# Patient Record
Sex: Female | Born: 1975 | Hispanic: Yes | Marital: Married | Smoking: Never smoker
Health system: Southern US, Community
[De-identification: ages and names within clinical notes are randomized; demographics above are authoritative.]

## PROBLEM LIST (undated history)

## (undated) DIAGNOSIS — B009 Herpesviral infection, unspecified: Secondary | ICD-10-CM

## (undated) HISTORY — PX: TUBAL LIGATION: SHX77

## (undated) HISTORY — DX: Herpesviral infection, unspecified: B00.9

---

## 2000-11-13 ENCOUNTER — Ambulatory Visit (HOSPITAL_COMMUNITY): Admission: RE | Admit: 2000-11-13 | Discharge: 2000-11-13 | Payer: Self-pay | Admitting: *Deleted

## 2001-04-03 ENCOUNTER — Inpatient Hospital Stay (HOSPITAL_COMMUNITY): Admission: AD | Admit: 2001-04-03 | Discharge: 2001-04-03 | Payer: Self-pay | Admitting: *Deleted

## 2001-04-04 ENCOUNTER — Inpatient Hospital Stay (HOSPITAL_COMMUNITY): Admission: AD | Admit: 2001-04-04 | Discharge: 2001-04-04 | Payer: Self-pay | Admitting: *Deleted

## 2001-04-04 ENCOUNTER — Inpatient Hospital Stay (HOSPITAL_COMMUNITY): Admission: AD | Admit: 2001-04-04 | Discharge: 2001-04-07 | Payer: Self-pay | Admitting: *Deleted

## 2002-07-01 ENCOUNTER — Encounter: Payer: Self-pay | Admitting: Obstetrics and Gynecology

## 2002-07-01 ENCOUNTER — Ambulatory Visit (HOSPITAL_COMMUNITY): Admission: RE | Admit: 2002-07-01 | Discharge: 2002-07-01 | Payer: Self-pay | Admitting: Obstetrics and Gynecology

## 2002-12-26 ENCOUNTER — Ambulatory Visit (HOSPITAL_COMMUNITY): Admission: RE | Admit: 2002-12-26 | Discharge: 2002-12-26 | Payer: Self-pay | Admitting: Family Medicine

## 2003-09-17 ENCOUNTER — Ambulatory Visit (HOSPITAL_COMMUNITY): Admission: RE | Admit: 2003-09-17 | Discharge: 2003-09-17 | Payer: Self-pay | Admitting: Family Medicine

## 2003-11-20 ENCOUNTER — Ambulatory Visit (HOSPITAL_COMMUNITY): Admission: RE | Admit: 2003-11-20 | Discharge: 2003-11-20 | Payer: Self-pay | Admitting: Obstetrics and Gynecology

## 2005-11-14 ENCOUNTER — Emergency Department (HOSPITAL_COMMUNITY): Admission: EM | Admit: 2005-11-14 | Discharge: 2005-11-14 | Payer: Self-pay | Admitting: Emergency Medicine

## 2015-09-21 ENCOUNTER — Other Ambulatory Visit: Payer: Self-pay | Admitting: Physician Assistant

## 2015-12-28 ENCOUNTER — Ambulatory Visit: Payer: Self-pay | Admitting: Physician Assistant

## 2015-12-31 ENCOUNTER — Ambulatory Visit: Payer: Self-pay | Admitting: Physician Assistant

## 2015-12-31 ENCOUNTER — Encounter: Payer: Self-pay | Admitting: Physician Assistant

## 2015-12-31 VITALS — BP 104/64 | HR 82 | Temp 98.1°F | Ht 59.5 in | Wt 172.8 lb

## 2015-12-31 DIAGNOSIS — L989 Disorder of the skin and subcutaneous tissue, unspecified: Secondary | ICD-10-CM

## 2015-12-31 NOTE — Progress Notes (Signed)
   BP 104/64 mmHg  Pulse 82  Temp(Src) 98.1 F (36.7 C)  Ht 4' 11.5" (1.511 m)  Wt 172 lb 12 oz (78.359 kg)  BMI 34.32 kg/m2  SpO2 99%   Subjective:    Patient ID: Carol Huff, female    DOB: 09-Mar-1976, 40 y.o.   MRN: [id number]  HPI: Carol Huff is a 40 y.o. female presenting on 12/31/2015 for Mass   HPI Chief Complaint  Patient presents with  . Mass    between R thigh and vulva. appeared about week and a half ago. has applied peroxide, pt states it has gotten a lot better. pt states she has band aid over it    Relevant past medical, surgical, family and social history reviewed and updated as indicated. Interim medical history since our last visit reviewed. Allergies and medications reviewed and updated.  Current outpatient prescriptions:  .  acyclovir (ZOVIRAX) 400 MG tablet, Take 1 tablet (400 mg total) by mouth 2 (two) times daily. Tome una tableta por boca dos veces diarias, Disp: 60 tablet, Rfl: 6   Review of Systems  Constitutional: Positive for chills and diaphoresis. Negative for fever, appetite change, fatigue and unexpected weight change.  HENT: Negative for congestion, dental problem, drooling, ear pain, facial swelling, hearing loss, mouth sores, sneezing, sore throat, trouble swallowing and voice change.   Eyes: Negative for pain, discharge, redness, itching and visual disturbance.  Respiratory: Negative for cough, choking, shortness of breath and wheezing.   Cardiovascular: Negative for chest pain, palpitations and leg swelling.  Gastrointestinal: Negative for vomiting, abdominal pain, diarrhea, constipation and blood in stool.  Endocrine: Negative for cold intolerance, heat intolerance and polydipsia.  Genitourinary: Negative for dysuria, hematuria and decreased urine volume.  Musculoskeletal: Negative for back pain, arthralgias and gait problem.  Skin: Negative for rash.  Allergic/Immunologic: Negative for environmental allergies.    Neurological: Negative for seizures, syncope, light-headedness and headaches.  Hematological: Negative for adenopathy.  Psychiatric/Behavioral: Negative for suicidal ideas, dysphoric mood and agitation. The patient is not nervous/anxious.     Per HPI unless specifically indicated above     Objective:    BP 104/64 mmHg  Pulse 82  Temp(Src) 98.1 F (36.7 C)  Ht 4' 11.5" (1.511 m)  Wt 172 lb 12 oz (78.359 kg)  BMI 34.32 kg/m2  SpO2 99%  Wt Readings from Last 3 Encounters:  12/31/15 172 lb 12 oz (78.359 kg)    Physical Exam  Constitutional: She is oriented to person, place, and time. She appears well-developed and well-nourished.  HENT:  Head: Normocephalic and atraumatic.  Pulmonary/Chest: Effort normal.  Genitourinary:     Tiny solitary lesion lateral to R labia- appears excoriated. No redness, no pus, no fluctuance. No vesicle.  (nurse Berenice assisted)  Neurological: She is alert and oriented to person, place, and time.  Skin: Skin is warm and dry.  Psychiatric: She has a normal mood and affect. Her behavior is normal.  Nursing note and vitals reviewed.      No results found for this or any previous visit.    Assessment & Plan:   Encounter Diagnosis  Name Primary?  . Bumps on skin Yes    Counseled pt on warm compresses.  Avoid peroxide.  Avoid scratching or picking at bump.  RTO if worsens or persists

## 2016-01-02 DIAGNOSIS — B009 Herpesviral infection, unspecified: Secondary | ICD-10-CM | POA: Insufficient documentation

## 2016-03-03 ENCOUNTER — Ambulatory Visit: Payer: Self-pay | Admitting: Physician Assistant

## 2016-03-07 ENCOUNTER — Other Ambulatory Visit: Payer: Self-pay | Admitting: Physician Assistant

## 2016-03-07 LAB — LIPID PANEL
Cholesterol: 179 mg/dL (ref 125–200)
HDL: 56 mg/dL (ref >=46)
LDL Cholesterol: 90 mg/dL (ref <130)
Total CHOL/HDL Ratio: 3.2 Ratio (ref <=5.0)
Triglycerides: 166 mg/dL — ABNORMAL HIGH (ref <150)
VLDL: 33 mg/dL — ABNORMAL HIGH (ref <30)

## 2016-03-07 LAB — COMPREHENSIVE METABOLIC PANEL
ALT: 19 U/L (ref 6–29)
AST: 28 U/L (ref 10–30)
Albumin: 3.8 g/dL (ref 3.6–5.1)
Alkaline Phosphatase: 64 U/L (ref 33–115)
BUN: 12 mg/dL (ref 7–25)
CO2: 25 mmol/L (ref 20–31)
Calcium: 8.8 mg/dL (ref 8.6–10.2)
Chloride: 104 mmol/L (ref 98–110)
Creat: 0.67 mg/dL (ref 0.50–1.10)
Glucose, Bld: 98 mg/dL (ref 65–99)
Potassium: 4.2 mmol/L (ref 3.5–5.3)
Sodium: 137 mmol/L (ref 135–146)
Total Bilirubin: 0.7 mg/dL (ref 0.2–1.2)
Total Protein: 6.7 g/dL (ref 6.1–8.1)

## 2016-03-07 LAB — CBC
HCT: 37.9 % (ref 35.0–45.0)
Hemoglobin: 12.2 g/dL (ref 11.7–15.5)
MCH: 27.2 pg (ref 27.0–33.0)
MCHC: 32.2 g/dL (ref 32.0–36.0)
MCV: 84.6 fL (ref 80.0–100.0)
MPV: 10.5 fL (ref 7.5–12.5)
Platelets: 310 10*3/uL (ref 140–400)
RBC: 4.48 MIL/uL (ref 3.80–5.10)
RDW: 15.3 % — ABNORMAL HIGH (ref 11.0–15.0)
WBC: 5.9 10*3/uL (ref 3.8–10.8)

## 2016-03-10 ENCOUNTER — Encounter: Payer: Self-pay | Admitting: Physician Assistant

## 2016-03-10 ENCOUNTER — Ambulatory Visit: Payer: Self-pay | Admitting: Physician Assistant

## 2016-03-10 VITALS — BP 104/70 | HR 79 | Temp 98.1°F | Ht 59.5 in | Wt 177.3 lb

## 2016-03-10 DIAGNOSIS — E669 Obesity, unspecified: Secondary | ICD-10-CM | POA: Insufficient documentation

## 2016-03-10 DIAGNOSIS — Z0000 Encounter for general adult medical examination without abnormal findings: Secondary | ICD-10-CM

## 2016-03-10 NOTE — Progress Notes (Signed)
BP 104/70 mmHg  Pulse 79  Temp(Src) 98.1 F (36.7 C)  Ht 4' 11.5" (1.511 m)  Wt 177 lb 4.8 oz (80.423 kg)  BMI 35.23 kg/m2  SpO2 98%   Subjective:    Patient ID: Carol Huff, female    DOB: 1976/03/28, 40 y.o.   MRN: [id number]  HPI: Carol Huff is a 40 y.o. female presenting on 03/10/2016 for Follow-up   HPI Pt is doing well and has no complaints.  Relevant past medical, surgical, family and social history reviewed and updated as indicated. Interim medical history since our last visit reviewed. Allergies and medications reviewed and updated.   Current outpatient prescriptions:  .  acyclovir (ZOVIRAX) 400 MG tablet, Take 1 tablet (400 mg total) by mouth 2 (two) times daily. Tome una tableta por [employer] veces diarias, Disp: 60 tablet, Rfl: 6   Review of Systems  Constitutional: Negative for fever, chills, diaphoresis, appetite change, fatigue and unexpected weight change.  HENT: Negative for congestion, dental problem, drooling, ear pain, facial swelling, hearing loss, mouth sores, sneezing, sore throat, trouble swallowing and voice change.   Eyes: Negative for pain, discharge, redness, itching and visual disturbance.  Respiratory: Negative for cough, choking, shortness of breath and wheezing.   Cardiovascular: Negative for chest pain, palpitations and leg swelling.  Gastrointestinal: Negative for vomiting, abdominal pain, diarrhea, constipation and blood in stool.  Endocrine: Negative for cold intolerance, heat intolerance and polydipsia.  Genitourinary: Negative for dysuria, hematuria and decreased urine volume.  Musculoskeletal: Negative for back pain, arthralgias and gait problem.  Skin: Negative for rash.  Allergic/Immunologic: Negative for environmental allergies.  Neurological: Negative for seizures, syncope, light-headedness and headaches.  Hematological: Negative for adenopathy.  Psychiatric/Behavioral: Negative for suicidal ideas, dysphoric  mood and agitation. The patient is not nervous/anxious.     Per HPI unless specifically indicated above     Objective:    BP 104/70 mmHg  Pulse 79  Temp(Src) 98.1 F (36.7 C)  Ht 4' 11.5" (1.511 m)  Wt 177 lb 4.8 oz (80.423 kg)  BMI 35.23 kg/m2  SpO2 98%  Wt Readings from Last 3 Encounters:  03/10/16 177 lb 4.8 oz (80.423 kg)  12/31/15 172 lb 12 oz (78.359 kg)    Physical Exam  Constitutional: She is oriented to person, place, and time. She appears well-developed and well-nourished.  HENT:  Head: Normocephalic and atraumatic.  Neck: Neck supple.  Cardiovascular: Normal rate and regular rhythm.   Pulmonary/Chest: Effort normal and breath sounds normal.  Abdominal: Soft. Bowel sounds are normal. She exhibits no mass. There is no hepatosplenomegaly. There is no tenderness.  Musculoskeletal: She exhibits no edema.  Lymphadenopathy:    She has no cervical adenopathy.  Neurological: She is alert and oriented to person, place, and time.  Skin: Skin is warm and dry.  Psychiatric: She has a normal mood and affect. Her behavior is normal.  Vitals reviewed.   Results for orders placed or performed in visit on 03/07/16  CBC  Result Value Ref Range   WBC 5.9 3.8 - 10.8 K/uL   RBC 4.48 3.80 - 5.10 MIL/uL   Hemoglobin 12.2 11.7 - 15.5 g/dL   HCT 40.9 81.1 - 91.4 %   MCV 84.6 80.0 - 100.0 fL   MCH 27.2 27.0 - 33.0 pg   MCHC 32.2 32.0 - 36.0 g/dL   RDW 78.2 (H) 95.6 - 21.3 %   Platelets 310 140 - 400 K/uL   MPV 10.5 7.5 - 12.5  fL  Comprehensive metabolic panel  Result Value Ref Range   Sodium 137 135 - 146 mmol/L   Potassium 4.2 3.5 - 5.3 mmol/L   Chloride 104 98 - 110 mmol/L   CO2 25 20 - 31 mmol/L   Glucose, Bld 98 65 - 99 mg/dL   BUN 12 7 - 25 mg/dL   Creat 0.270.67 2.530.50 - 6.641.10 mg/dL   Total Bilirubin 0.7 0.2 - 1.2 mg/dL   Alkaline Phosphatase 64 33 - 115 U/L   AST 28 10 - 30 U/L   ALT 19 6 - 29 U/L   Total Protein 6.7 6.1 - 8.1 g/dL   Albumin 3.8 3.6 - 5.1 g/dL    Calcium 8.8 8.6 - 40.310.2 mg/dL  Lipid panel  Result Value Ref Range   Cholesterol 179 125 - 200 mg/dL   Triglycerides 474166 (H) <150 mg/dL   HDL 56 >=25>=46 mg/dL   Total CHOL/HDL Ratio 3.2 <=5.0 Ratio   VLDL 33 (H) <30 mg/dL   LDL Cholesterol 90 <956<130 mg/dL      Assessment & Plan:   Encounter Diagnoses  Name Primary?  . Well adult health check Yes  . Obesity, unspecified     -reviewed labs with pt -counseled on wt -f/u 1 year- no labs before that appointment

## 2016-07-14 ENCOUNTER — Other Ambulatory Visit: Payer: Self-pay | Admitting: Physician Assistant

## 2017-01-10 ENCOUNTER — Other Ambulatory Visit: Payer: Self-pay | Admitting: Physician Assistant

## 2017-03-09 ENCOUNTER — Ambulatory Visit: Payer: Self-pay | Admitting: Physician Assistant

## 2017-03-09 ENCOUNTER — Encounter: Payer: Self-pay | Admitting: Physician Assistant

## 2017-03-09 VITALS — BP 108/78 | HR 86 | Temp 98.1°F | Ht 59.5 in | Wt 181.0 lb

## 2017-03-09 DIAGNOSIS — K5900 Constipation, unspecified: Secondary | ICD-10-CM

## 2017-03-09 DIAGNOSIS — Z0000 Encounter for general adult medical examination without abnormal findings: Secondary | ICD-10-CM

## 2017-03-09 DIAGNOSIS — E669 Obesity, unspecified: Secondary | ICD-10-CM

## 2017-03-09 DIAGNOSIS — B009 Herpesviral infection, unspecified: Secondary | ICD-10-CM

## 2017-03-09 MED ORDER — ACYCLOVIR 400 MG PO TABS: ORAL_TABLET | ORAL | 11 refills | Status: DC

## 2017-03-09 NOTE — Progress Notes (Signed)
BP 108/78 (BP Location: Left Arm, Patient Position: Sitting, Cuff Size: Normal)   Pulse 86   Temp 98.1 F (36.7 C) (Other (Comment))   Ht 4' 11.5" (1.511 m)   Wt 181 lb (82.1 kg)   LMP 02/10/2017 (Exact Date) Comment: BTL  SpO2 98%   BMI 35.95 kg/m    Subjective:    Patient ID: Carol Huff, female    DOB: Oct 19, 1975, 41 y.o.   MRN: [id number]  HPI: Carol Huff is a 41 y.o. female presenting on 03/09/2017 for Follow-up   HPI   Pt is doing well.  No complaints except some mild constipation  Relevant past medical, surgical, family and social history reviewed and updated as indicated. Interim medical history since our last visit reviewed. Allergies and medications reviewed and updated.   Current Outpatient Prescriptions:  .  acyclovir (ZOVIRAX) 400 MG tablet, Take 1 tablet by mouth twice daily. Tome una tableta por boca dos veces diarias, Disp: 60 tablet, Rfl: 1   Review of Systems  Constitutional: Positive for unexpected weight change. Negative for appetite change, chills, diaphoresis, fatigue and fever.  HENT: Negative for congestion, dental problem, drooling, ear pain, facial swelling, hearing loss, mouth sores, sneezing, sore throat, trouble swallowing and voice change.   Eyes: Negative for pain, discharge, redness, itching and visual disturbance.  Respiratory: Negative for cough, choking, shortness of breath and wheezing.   Cardiovascular: Negative for chest pain, palpitations and leg swelling.  Gastrointestinal: Positive for constipation. Negative for abdominal pain, blood in stool, diarrhea and vomiting.  Endocrine: Negative for cold intolerance, heat intolerance and polydipsia.  Genitourinary: Negative for decreased urine volume, dysuria and hematuria.  Musculoskeletal: Negative for arthralgias, back pain and gait problem.  Skin: Negative for rash.  Allergic/Immunologic: Positive for environmental allergies.  Neurological: Positive for headaches.  Negative for seizures, syncope and light-headedness.  Hematological: Negative for adenopathy.  Psychiatric/Behavioral: Negative for agitation, dysphoric mood and suicidal ideas. The patient is not nervous/anxious.     Per HPI unless specifically indicated above     Objective:    BP 108/78 (BP Location: Left Arm, Patient Position: Sitting, Cuff Size: Normal)   Pulse 86   Temp 98.1 F (36.7 C) (Other (Comment))   Ht 4' 11.5" (1.511 m)   Wt 181 lb (82.1 kg)   LMP 02/10/2017 (Exact Date) Comment: BTL  SpO2 98%   BMI 35.95 kg/m   Wt Readings from Last 3 Encounters:  03/09/17 181 lb (82.1 kg)  03/10/16 177 lb 4.8 oz (80.4 kg)  12/31/15 172 lb 12 oz (78.4 kg)    Physical Exam  Constitutional: She is oriented to person, place, and time. She appears well-developed and well-nourished.  HENT:  Head: Normocephalic and atraumatic.  Neck: Neck supple.  Cardiovascular: Normal rate and regular rhythm.   Pulmonary/Chest: Effort normal and breath sounds normal.  Abdominal: Soft. Bowel sounds are normal. She exhibits no mass. There is no hepatosplenomegaly. There is no tenderness.  Musculoskeletal: She exhibits no edema.  Lymphadenopathy:    She has no cervical adenopathy.  Neurological: She is alert and oriented to person, place, and time.  Skin: Skin is warm and dry.  Psychiatric: She has a normal mood and affect. Her behavior is normal.  Vitals reviewed.       Assessment & Plan:    Encounter Diagnoses  Name Primary?  . Well adult health check Yes  . Obesity, unspecified classification, unspecified obesity type, unspecified whether serious comorbidity present   . HSV  infection   . Constipation, unspecified constipation type      -pt counseled on constipation and encouraged increased water and fiber.  Gave handout -pt to continue acyclovir -follow up 1 year with PAP at that appt. RTO sooner prn

## 2017-03-09 NOTE — Patient Instructions (Signed)
Constipacin en los adultos (Constipation, Adult) Constipacin significa que una persona defeca en una semana menos que lo normal, hay dificultad para defecar, o las heces son secas, duras, o ms grandes que lo normal. La causa puede ser una afeccin subyacente. Puede empeorar con la edad si una persona toma ciertos medicamentos y no toma suficiente lquido. INSTRUCCIONES PARA EL CUIDADO EN EL HOGAR Comida y bebida  Consuma alimentos con alto contenido de fibra, como frutas y verduras frescas, cereales integrales y frijoles.  Limite los alimentos ricos en grasas y con bajo contenido de fibra, o muy procesados, como las papas fritas, hamburguesas, galletas, dulces y refrescos.  Beba suficiente lquido para mantener la orina clara o de color amarillo plido. Instrucciones generales  Haga actividad fsica habitualmente o como se lo haya indicado el mdico.  Vaya al bao cuando sienta la necesidad de ir. No se aguante las ganas.  Tome los medicamentos de venta libre y los recetados solamente como se lo haya indicado el mdico. Estos incluyen los suplementos de fibra.  Practique ejercicios de rehabilitacin del suelo plvico, como la respiracin profunda mientras relaja la parte inferior del abdomen y relajacin del suelo plvico mientras defeca.  Controle su afeccin para ver si hay cambios.  Concurra a todas las visitas de control como se lo haya indicado el mdico. Esto es importante. SOLICITE ATENCIN MDICA SI:  Siente un dolor que empeora.  Tiene fiebre.  No defeca despus de 4das.  Vomita.  No tiene hambre.  Pierde peso.  Tiene una hemorragia en el ano.  Las heces son delgadas como un lpiz. SOLICITE ATENCIN MDICA DE INMEDIATO SI:  Tiene fiebre y los sntomas empeoran repentinamente.  Observa que se filtran heces o hay sangre en las heces.  Tiene el abdomen hinchado.  Siente un dolor intenso en el abdomen.  Se siente mareado o se desmaya. Esta informacin no  tiene como fin reemplazar el consejo del mdico. Asegrese de hacerle al mdico cualquier pregunta que tenga. Document Released: 10/23/2007 Document Revised: 10/24/2014 Document Reviewed: 03/23/2016 Elsevier Interactive Patient Education  2017 [employer]  

## 2017-08-01 ENCOUNTER — Ambulatory Visit: Payer: Self-pay | Admitting: Physician Assistant

## 2018-03-08 ENCOUNTER — Ambulatory Visit: Payer: Self-pay | Admitting: Physician Assistant

## 2018-03-13 ENCOUNTER — Encounter: Payer: Self-pay | Admitting: Physician Assistant

## 2018-03-13 ENCOUNTER — Ambulatory Visit: Payer: Self-pay | Admitting: Physician Assistant

## 2018-03-13 ENCOUNTER — Other Ambulatory Visit (HOSPITAL_COMMUNITY)
Admission: RE | Admit: 2018-03-13 | Discharge: 2018-03-13 | Disposition: A | Discharge status: Home / Self Care | Payer: Self-pay | Source: Ambulatory Visit | Attending: Physician Assistant | Admitting: Physician Assistant

## 2018-03-13 VITALS — BP 110/72 | HR 95 | Temp 98.1°F | Ht 59.5 in | Wt 181.5 lb

## 2018-03-13 DIAGNOSIS — Z0000 Encounter for general adult medical examination without abnormal findings: Secondary | ICD-10-CM

## 2018-03-13 DIAGNOSIS — B009 Herpesviral infection, unspecified: Secondary | ICD-10-CM

## 2018-03-13 DIAGNOSIS — Z124 Encounter for screening for malignant neoplasm of cervix: Secondary | ICD-10-CM

## 2018-03-13 DIAGNOSIS — E669 Obesity, unspecified: Secondary | ICD-10-CM

## 2018-03-13 DIAGNOSIS — Z1151 Encounter for screening for human papillomavirus (HPV): Secondary | ICD-10-CM | POA: Insufficient documentation

## 2018-03-13 MED ORDER — ACYCLOVIR 400 MG PO TABS: ORAL_TABLET | ORAL | 11 refills | Status: DC

## 2018-03-13 NOTE — Progress Notes (Signed)
BP 110/72 (BP Location: Left Arm, Patient Position: Sitting, Cuff Size: Large)   Pulse 95   Temp 98.1 F (36.7 C) (Other (Comment))   Ht 4' 11.5" (1.511 m)   Wt 181 lb 8 oz (82.3 kg)   LMP 02/20/2018 (Exact Date)   SpO2 98%   BMI 36.05 kg/m    Subjective:    Patient ID: Carol Huff, female    DOB: 08-Apr-1976, 42 y.o.   MRN: [id number]  HPI: Carol Huff is a 42 y.o. female presenting on 03/13/2018 for Follow-up   HPI   Pt is doing well.  She has no complaints today.    Labs good 2017.  Repeat testing not indicated today    Relevant past medical, surgical, family and social history reviewed and updated as indicated. Interim medical history since our last visit reviewed. Allergies and medications reviewed and updated.   Current Outpatient Medications:  .  acyclovir (ZOVIRAX) 400 MG tablet, Take 1 tablet by mouth twice daily. Tome una tableta por [employer] veces diarias, Disp: 60 tablet, Rfl: 11   Review of Systems  Constitutional: Negative for appetite change, chills, diaphoresis, fatigue, fever and unexpected weight change.  HENT: Negative for congestion, drooling, ear pain, facial swelling, hearing loss, mouth sores, sneezing, sore throat, trouble swallowing and voice change.   Eyes: Negative for pain, discharge, redness, itching and visual disturbance.  Respiratory: Negative for cough, choking, shortness of breath and wheezing.   Cardiovascular: Positive for leg swelling. Negative for chest pain and palpitations.  Gastrointestinal: Negative for abdominal pain, blood in stool, constipation, diarrhea and vomiting.  Endocrine: Negative for cold intolerance, heat intolerance and polydipsia.  Genitourinary: Negative for decreased urine volume, dysuria and hematuria.  Musculoskeletal: Negative for arthralgias, back pain and gait problem.  Skin: Negative for rash.  Allergic/Immunologic: Positive for environmental allergies.  Neurological: Negative for  seizures, syncope, light-headedness and headaches.  Hematological: Negative for adenopathy.  Psychiatric/Behavioral: Negative for agitation, dysphoric mood and suicidal ideas. The patient is not nervous/anxious.     Per HPI unless specifically indicated above     Objective:    BP 110/72 (BP Location: Left Arm, Patient Position: Sitting, Cuff Size: Large)   Pulse 95   Temp 98.1 F (36.7 C) (Other (Comment))   Ht 4' 11.5" (1.511 m)   Wt 181 lb 8 oz (82.3 kg)   LMP 02/20/2018 (Exact Date)   SpO2 98%   BMI 36.05 kg/m   Wt Readings from Last 3 Encounters:  03/13/18 181 lb 8 oz (82.3 kg)  03/09/17 181 lb (82.1 kg)  03/10/16 177 lb 4.8 oz (80.4 kg)    Physical Exam  Constitutional: She is oriented to person, place, and time. She appears well-developed and well-nourished.  HENT:  Head: Normocephalic and atraumatic.  Neck: Neck supple.  Cardiovascular: Normal rate and regular rhythm.  Pulmonary/Chest: Effort normal and breath sounds normal. No breast tenderness, discharge or bleeding.  Breast exam normal  Abdominal: Soft. Bowel sounds are normal. She exhibits no mass. There is no hepatosplenomegaly. There is no tenderness. There is no rebound and no guarding.  Genitourinary: Vagina normal and uterus normal. There is no rash, tenderness or lesion on the right labia. There is no rash, tenderness or lesion on the left labia. Cervix exhibits no motion tenderness, no discharge and no friability. Right adnexum displays no mass, no tenderness and no fullness. Left adnexum displays no mass, no tenderness and no fullness.  Genitourinary Comments: (nurse Berenice assisted)  Musculoskeletal:  She exhibits no edema.  Lymphadenopathy:    She has no cervical adenopathy.  Neurological: She is alert and oriented to person, place, and time.  Skin: Skin is warm and dry.  Psychiatric: She has a normal mood and affect. Her behavior is normal.  Nursing note and vitals reviewed.       Assessment &  Plan:      Encounter Diagnoses  Name Primary?  . Well adult health check Yes  . Routine Papanicolaou smear   . HSV infection   . Obesity, unspecified classification, unspecified obesity type, unspecified whether serious comorbidity present    -pt counseled to follow healthy diet and exercise regularly to maintain good health -follow up 1 year.  RTO sooner prn

## 2018-07-19 ENCOUNTER — Encounter: Payer: Self-pay | Admitting: Physician Assistant

## 2018-07-19 ENCOUNTER — Ambulatory Visit: Payer: Self-pay | Admitting: Physician Assistant

## 2018-07-19 VITALS — BP 103/68 | HR 67 | Temp 98.1°F | Ht 59.5 in | Wt 182.2 lb

## 2018-07-19 DIAGNOSIS — N9089 Other specified noninflammatory disorders of vulva and perineum: Secondary | ICD-10-CM

## 2018-07-19 NOTE — Progress Notes (Signed)
   BP 103/68 (BP Location: Right Arm, Patient Position: Sitting, Cuff Size: Normal)   Pulse 67   Temp 98.1 F (36.7 C)   Ht 4' 11.5" (1.511 m)   Wt 182 lb 4 oz (82.7 kg)   SpO2 98%   BMI 36.19 kg/m    Subjective:    Patient ID: Carol Huff, female    DOB: [birth year], 42 y.o.   MRN: [id number]  HPI: Carol Huff is a 42 y.o. female presenting on 07/19/2018 for Mass (pt states she had bump about a year ago that scared and for about about a month now is bigger, painful, and red.)   HPI  Chief Complaint  Patient presents with  . Mass    pt states she had bump about a year ago that scared and for about about a month now is bigger, painful, and red.    Relevant past medical, surgical, family and social history reviewed and updated as indicated. Interim medical history since our last visit reviewed. Allergies and medications reviewed and updated.  Review of Systems  Per HPI unless specifically indicated above     Objective:    BP 103/68 (BP Location: Right Arm, Patient Position: Sitting, Cuff Size: Normal)   Pulse 67   Temp 98.1 F (36.7 C)   Ht 4' 11.5" (1.511 m)   Wt 182 lb 4 oz (82.7 kg)   SpO2 98%   BMI 36.19 kg/m   Wt Readings from Last 3 Encounters:  07/19/18 182 lb 4 oz (82.7 kg)  03/13/18 181 lb 8 oz (82.3 kg)  03/09/17 181 lb (82.1 kg)    Physical Exam  Constitutional: She is oriented to person, place, and time. She appears well-developed and well-nourished.  HENT:  Head: Normocephalic and atraumatic.  Pulmonary/Chest: Effort normal. No respiratory distress.  Genitourinary:     Genitourinary Comments: Approximately 6mm round well circumscribed bump L labia.  Appears somewhat like a pyogenic granuloma (nurse Berenice assisted)  Neurological: She is alert and oriented to person, place, and time.  Skin: Skin is warm and dry.  Psychiatric: She has a normal mood and affect. Her behavior is normal.  Nursing note and vitals  reviewed.        Assessment & Plan:    Encounter Diagnosis  Name Primary?  . Lesion of labia Yes    -Refer to dermatology for evaluation of bump.  Encouraged pt to take translator with her -pt to Follow up here as scheduled

## 2018-08-22 ENCOUNTER — Encounter: Payer: Self-pay | Admitting: Physician Assistant

## 2018-08-22 ENCOUNTER — Ambulatory Visit: Payer: Self-pay | Admitting: Physician Assistant

## 2018-08-22 VITALS — BP 110/70 | HR 99 | Temp 98.1°F | Ht 59.5 in | Wt 182.0 lb

## 2018-08-22 DIAGNOSIS — N9089 Other specified noninflammatory disorders of vulva and perineum: Secondary | ICD-10-CM

## 2018-08-22 NOTE — Progress Notes (Signed)
BP 110/70 (BP Location: Left Arm, Patient Position: Sitting, Cuff Size: Normal)   Pulse 99   Temp 98.1 F (36.7 C)   Ht 4' 11.5" (1.511 m)   Wt 182 lb (82.6 kg)   SpO2 97%   BMI 36.14 kg/m    Subjective:    Patient ID: Carol Huff, female    DOB: [birth year], 42 y.o.   MRN: [id number]  HPI: Carol Huff is a 42 y.o. female presenting on 08/22/2018 for No chief complaint on file.   HPI  Pt here for bump on labia.  Pt states it is same bump as she had at last OV 1 mo ago.  She says it is a little smaller but now bleeds and pulses.  She was referred to dermatology for likely pyogenic granuloma.  Pt states they "burned it".  Consult note for that visit has not yet been received.  She says she was told to follow up here if needed.   Relevant past medical, surgical, family and social history reviewed and updated as indicated. Interim medical history since our last visit reviewed. Allergies and medications reviewed and updated.   Current Outpatient Medications:  .  acyclovir (ZOVIRAX) 400 MG tablet, Take 1 tablet by mouth twice daily. Tome una tableta por [employer] veces diarias, Disp: 60 tablet, Rfl: 11   Review of Systems  Constitutional: Negative for appetite change, chills, diaphoresis, fatigue, fever and unexpected weight change.  HENT: Negative for congestion, dental problem, drooling, ear pain, facial swelling, hearing loss, mouth sores, sneezing, sore throat, trouble swallowing and voice change.   Eyes: Negative for pain, discharge, redness, itching and visual disturbance.  Respiratory: Negative for cough, choking, shortness of breath and wheezing.   Cardiovascular: Negative for chest pain, palpitations and leg swelling.  Gastrointestinal: Negative for abdominal pain, blood in stool, constipation, diarrhea and vomiting.  Endocrine: Negative for cold intolerance, heat intolerance and polydipsia.  Genitourinary: Negative for decreased urine volume, dysuria and  hematuria.  Musculoskeletal: Negative for arthralgias, back pain and gait problem.  Skin: Negative for rash.  Allergic/Immunologic: Negative for environmental allergies.  Neurological: Positive for headaches. Negative for seizures, syncope and light-headedness.  Hematological: Negative for adenopathy.  Psychiatric/Behavioral: Negative for agitation, dysphoric mood and suicidal ideas. The patient is not nervous/anxious.     Per HPI unless specifically indicated above     Objective:    BP 110/70 (BP Location: Left Arm, Patient Position: Sitting, Cuff Size: Normal)   Pulse 99   Temp 98.1 F (36.7 C)   Ht 4' 11.5" (1.511 m)   Wt 182 lb (82.6 kg)   SpO2 97%   BMI 36.14 kg/m   Wt Readings from Last 3 Encounters:  08/22/18 182 lb (82.6 kg)  07/19/18 182 lb 4 oz (82.7 kg)  03/13/18 181 lb 8 oz (82.3 kg)    Physical Exam  Constitutional: She is oriented to person, place, and time. She appears well-developed and well-nourished.  HENT:  Head: Normocephalic and atraumatic.  Pulmonary/Chest: Effort normal. No respiratory distress.  Genitourinary:     Genitourinary Comments: Beefy Small bump consistent with pyogenic granuloma.  No active bleeding.  Smaller than at previous OV (nurse Berenice assisted)  Neurological: She is alert and oriented to person, place, and time.  Skin: Skin is warm and dry.  Nursing note and vitals reviewed.       Assessment & Plan:   Encounter Diagnosis  Name Primary?  . Lesion of labia Yes    -records  requested from dermatology.   -no further treatment recommended at this time.  Pt is counseled to apply direct pressure it if begins to bleed.  We will contact pt after reviewing records from dermatology. -pt to follow up as scheduled.  RTO sooner prn

## 2018-08-29 ENCOUNTER — Telehealth: Payer: Self-pay | Admitting: Student

## 2018-08-29 NOTE — Telephone Encounter (Signed)
LPN called pt after receiving notes from Dermatology visit. Pt was informed that notes states mass is benign. Pt is to have pelvic rest (no sex) and is to wash her vaginal area softly with a cloth or with her hand. Pt is to apply pressure if bleeding occurs as discussed at last OV. Pt is to call the office if new symptoms occur or if worsen. Pt verbalized understanding.

## 2018-09-11 ENCOUNTER — Encounter: Payer: Self-pay | Admitting: Physician Assistant

## 2019-03-14 ENCOUNTER — Ambulatory Visit: Payer: Self-pay | Admitting: Physician Assistant

## 2019-03-20 ENCOUNTER — Other Ambulatory Visit: Payer: Self-pay | Admitting: Physician Assistant

## 2019-05-09 ENCOUNTER — Ambulatory Visit: Payer: Self-pay | Admitting: Physician Assistant

## 2019-05-09 ENCOUNTER — Encounter: Payer: Self-pay | Admitting: Physician Assistant

## 2019-05-09 DIAGNOSIS — Z0000 Encounter for general adult medical examination without abnormal findings: Secondary | ICD-10-CM

## 2019-05-09 MED ORDER — ACYCLOVIR 400 MG PO TABS: ORAL_TABLET | ORAL | 11 refills | Status: DC

## 2019-05-09 NOTE — Progress Notes (Signed)
   There were no vitals taken for this visit.   Subjective:    Patient ID: Carol Huff, female    DOB: 10/03/1976, 43 y.o.   MRN: [id number]  HPI: Carol Huff is a 43 y.o. female presenting on 05/09/2019 for Follow-up   HPI    This is a telemedicine appointment through [employer] due to coronavirus pandemic  I connected with  Carol Huff on 05/09/19 by a video enabled telemedicine application and verified that I am speaking with the correct person using two identifiers.   I discussed the limitations of evaluation and management by telemedicine. The patient expressed understanding and agreed to proceed.  Pt is at home.  Provider is at office    Pt says she is doing well and she has no complaints. 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 She says she is wearing a mask when she goes out in public.   Relevant past medical, surgical, family and social history reviewed and updated as indicated. Interim medical history since our last visit reviewed. Allergies and medications reviewed and updated..   Current Outpatient Medications:  .  acyclovir (ZOVIRAX) 400 MG tablet, Take 1 tablet by mouth twice daily, Disp: 60 tablet, Rfl: 1    Review of Systems  Per HPI unless specifically indicated above     Objective:    There were no vitals taken for this visit.  Wt Readings from Last 3 Encounters:  08/22/18 182 lb (82.6 kg)  07/19/18 182 lb 4 oz (82.7 kg)  03/13/18 181 lb 8 oz (82.3 kg)    Physical Exam Constitutional:      General: She is not in acute distress.    Appearance: Normal appearance. She is not ill-appearing.  HENT:     Head: Normocephalic and atraumatic.  Pulmonary:     Effort: Pulmonary effort is normal. No respiratory distress.  Neurological:     Mental Status: She is alert and oriented to person, place, and time.  Psychiatric:        Attention and Perception: Attention normal.        Mood and Affect: Mood normal.         Speech: Speech normal.        Behavior: Behavior is cooperative.         Assessment & Plan:    Encounter Diagnosis  Name Primary?  . Well adult health check Yes     -discussed breast cancer screening.  Pt has No family history of breast cancer so will start screening mammograms at age 3.  Pt in agreement with plan -PAP up to date -encouraged regular exercise and healthy diet -pt to Follow up 1 year.  She is to contact office sooner prn

## 2019-06-27 ENCOUNTER — Other Ambulatory Visit: Payer: Self-pay

## 2019-06-27 DIAGNOSIS — Z20822 Contact with and (suspected) exposure to covid-19: Secondary | ICD-10-CM

## 2019-06-28 LAB — NOVEL CORONAVIRUS, NAA: SARS-CoV-2, NAA: DETECTED — AB

## 2019-07-16 ENCOUNTER — Telehealth: Payer: Self-pay

## 2019-07-16 NOTE — Telephone Encounter (Signed)
Pt. Eligibility for [facility] is 07/16/2019 till 07/15/2020. Did not fax over [facility] because pt. Needs husbands self-employment form.  Drema Halon

## 2020-04-27 ENCOUNTER — Other Ambulatory Visit: Payer: Self-pay

## 2020-04-27 ENCOUNTER — Ambulatory Visit: Payer: Self-pay | Admitting: Physician Assistant

## 2020-04-27 ENCOUNTER — Encounter: Payer: Self-pay | Admitting: Physician Assistant

## 2020-04-27 VITALS — BP 100/60 | HR 73 | Temp 98.6°F | Ht 59.75 in | Wt 177.0 lb

## 2020-04-27 DIAGNOSIS — Z0000 Encounter for general adult medical examination without abnormal findings: Secondary | ICD-10-CM

## 2020-04-27 DIAGNOSIS — R1011 Right upper quadrant pain: Secondary | ICD-10-CM

## 2020-04-27 DIAGNOSIS — Z789 Other specified health status: Secondary | ICD-10-CM

## 2020-04-27 MED ORDER — ACYCLOVIR 400 MG PO TABS: ORAL_TABLET | ORAL | 11 refills | Status: DC

## 2020-04-27 NOTE — Patient Instructions (Signed)
Plan de alimentacin para problemas de vescula biliar Gallbladder Eating Plan Si tiene una afeccin de la vescula biliar, puede tener problemas para digerir las grasas. Consumir una dieta con bajo contenido de grasas puede disminuir los sntomas, y puede ser beneficiosa antes y despus de una ciruga de extraccin de vescula biliar (colecistectoma). El mdico puede recomendarle que trabaje con un especialista en dietas y alimentacin (nutricionista) para que lo ayude a reducir la cantidad de grasas en su dieta. Consejos para seguir este plan [place] generales  Limite el consumo de grasas a menos del 30% del total de caloras diarias. Si usted ingiere alrededor de 1800 caloras diarias, esto es menos de 60 gramos (g) de grasas por da.  La grasa es una parte importante de una dieta saludable. Consumir una dieta con bajo contenido de grasas puede dificultar mantener un peso corporal saludable. Pregunte a su nutricionista qu cantidad de grasas, caloras y otros nutrientes necesita diariamente.  Haga comidas pequeas y frecuentes durante el da en lugar de tres comidas abundantes.  Beba de 8 a 10 vasos de lquido por da como mnimo. Beba suficiente lquido como para mantener la orina clara o de color amarillo plido.  Limite el consumo de alcohol a no ms de 1medida por da si es mujer y no est embarazada, y 2medidas por da si es hombre. Una medida equivale a 12oz (355ml) de cerveza, 5oz (148ml) de vino o 1oz (44ml) de bebidas alcohlicas de alta graduacin. Lea las etiquetas de los alimentos  Consulte la informacin nutricional en las etiquetas de los alimentos para conocer la cantidad de grasas por porcin. Elija alimentos con menos de 3 gramos de grasas por porcin. De compras  Elija alimentos saludables sin grasas o con bajo contenido de grasas. Busque las palabras "sin grasa", "bajo en grasas" o "con bajo contenido de grasas".  Evite comprar alimentos procesados o  envasados. Coccin  Para cocinar opte por mtodos con bajo contenido de grasa, como hornear, hervir, grillar y asar.  Cocine con pequeas cantidades de grasas saludables, como aceite de oliva, aceite de semilla de uva, aceite de canola o girasol. Qu alimentos se recomiendan?   Todas las frutas y verduras frescas, congeladas o enlatadas.  Cereales integrales.  Leche y yogur semidescremados y descremados.  Carne magra, aves sin piel, pescado, huevos y legumbres.  Suplementos proteicos con bajo contenido de grasas, en polvo o lquidos.  Hierbas y especias. Qu alimentos no se recomiendan?  Alimentos muy grasos. Entre estos se incluyen productos panificados, comida rpida, cortes de carne con grasa, helados, pan francs, rosquillas dulces, pizza, pan de queso, alimentos cubiertos con manteca, salsas con crema o queso.  Comidas fritas. Se incluyen papas fritas, tempura, pescado rebozado, milanesas de pollo, panes fritos y dulces.  Alimentos con olores fuertes.  Alimentos que causan gases o meteorismo. Resumen  Una dieta de bajo contenido graso puede ser beneficiosa si tiene una afeccin de la vescula biliar o puede hacerla antes y despus de someterse a una ciruga de vescula.  Limite el consumo de grasas a menos del 30% del total de caloras diarias. Esto es casi 60 gramos de grasa si usted ingiere 1800 caloras diarias.  Haga comidas pequeas y frecuentes durante el da en lugar de tres comidas abundantes. Esta informacin no tiene como fin reemplazar el consejo del mdico. Asegrese de hacerle al mdico cualquier pregunta que tenga. Document Revised: 05/09/2017 Document Reviewed: 05/09/2017 Elsevier Patient Education  2020 Elsevier Inc.  

## 2020-04-27 NOTE — Progress Notes (Signed)
BP 100/60   Pulse 73   Temp 98.6 F (37 C)   Ht 4' 11.75" (1.518 m)   Wt 177 lb (80.3 kg)   SpO2 98%   BMI 34.86 kg/m    Subjective:    Patient ID: Carol Huff, female    DOB: 05-Dec-1975, 44 y.o.   MRN: [id number]  HPI: Carol Huff is a 44 y.o. female presenting on 04/27/2020 for Annual Exam   HPI   Pt had a negative covid 19 screening questionnaire.   Pt is 43yoF with appointment for annual follow up.  She says she is having some RUQ abdominal pain.     It happened 3 months ago and then again last night-  RUQ abd pain.  No pain currently.   Both times pain lasted about 3 hours.  She did not take anything to try to make it better.  No emesis.  Some nausea.  No diarrhea.    Pap due 2024  No family history of breast CA.   Discussed starting screeing mammograms at age 14.  She got her covid vaccination   Relevant past medical, surgical, family and social history reviewed and updated as indicated. Interim medical history since our last visit reviewed. Allergies and medications reviewed and updated.   Current Outpatient Medications:  .  acyclovir (ZOVIRAX) 400 MG tablet, Take 1 tablet by mouth twice daily, Disp: 60 tablet, Rfl: 11     Review of Systems  Per HPI unless specifically indicated above     Objective:    BP 100/60   Pulse 73   Temp 98.6 F (37 C)   Ht 4' 11.75" (1.518 m)   Wt 177 lb (80.3 kg)   SpO2 98%   BMI 34.86 kg/m   Wt Readings from Last 3 Encounters:  04/27/20 177 lb (80.3 kg)  08/22/18 182 lb (82.6 kg)  07/19/18 182 lb 4 oz (82.7 kg)    Physical Exam Vitals reviewed.  Constitutional:      Appearance: She is well-developed.  HENT:     Head: Normocephalic and atraumatic.  Cardiovascular:     Rate and Rhythm: Normal rate and regular rhythm.  Pulmonary:     Effort: Pulmonary effort is normal.     Breath sounds: Normal breath sounds.  Abdominal:     General: Bowel sounds are normal.     Palpations: Abdomen  is soft. There is no mass.     Tenderness: There is no abdominal tenderness.     Comments: Pt is wearing a girdle  Musculoskeletal:     Cervical back: Neck supple.     Right lower leg: No edema.     Left lower leg: No edema.  Lymphadenopathy:     Cervical: No cervical adenopathy.  Skin:    General: Skin is warm and dry.  Neurological:     Mental Status: She is alert and oriented to person, place, and time.  Psychiatric:        Behavior: Behavior normal.         Assessment & Plan:    Encounter Diagnoses  Name Primary?  . Well adult health check Yes  . Not proficient in [place] language   . Right upper quadrant abdominal pain      -discussed to avoid fatty foods as likely cause fro RUQ abd pain.  Pt is to contact office if her pain worsens, persists or if she develops new symptoms -Pap UTD.  mammogram - discussed with pt and  will start age 41 -pt encouraged to exercise regularly to improved overall leath -Follow up 1 year.  She is to contact office sooner prn

## 2020-06-29 ENCOUNTER — Other Ambulatory Visit: Payer: Self-pay | Admitting: Student

## 2020-06-29 DIAGNOSIS — Z23 Encounter for immunization: Secondary | ICD-10-CM

## 2020-07-16 ENCOUNTER — Encounter: Payer: Self-pay | Admitting: Physician Assistant

## 2020-07-16 ENCOUNTER — Other Ambulatory Visit: Payer: Self-pay

## 2020-07-16 ENCOUNTER — Ambulatory Visit: Payer: Self-pay | Admitting: Physician Assistant

## 2020-07-16 VITALS — BP 100/62 | HR 75 | Temp 98.0°F | Ht 59.75 in | Wt 177.8 lb

## 2020-07-16 DIAGNOSIS — R1011 Right upper quadrant pain: Secondary | ICD-10-CM

## 2020-07-16 DIAGNOSIS — E669 Obesity, unspecified: Secondary | ICD-10-CM

## 2020-07-16 DIAGNOSIS — Z789 Other specified health status: Secondary | ICD-10-CM

## 2020-07-16 NOTE — Progress Notes (Signed)
BP 100/62   Pulse 75   Temp 98 F (36.7 C)   Ht 4' 11.75" (1.518 m)   Wt 177 lb 12.8 oz (80.6 kg)   SpO2 99%   BMI 35.02 kg/m    Subjective:    Patient ID: Carol Huff, female    DOB: Nov 18, 1975, 44 y.o.   MRN: [id number]  HPI: Carol Huff is a 44 y.o. female presenting on 07/16/2020 for Abdominal Pain Alfredo Batty. pt states she had mentioned this pain in the past. pt states the pain is intermittent and are becoming more frequent lasting 3-4 hours. pt states last time she has pain was on Tuesday. pain is usually at night. pt reports no difference in pain after eating. pt has not tried anything to help with pain)   HPI   Chief Complaint  Patient presents with  . Abdominal Pain    URQ. pt states she had mentioned this pain in the past. pt states the pain is intermittent and are becoming more frequent lasting 3-4 hours. pt states last time she has pain was on Tuesday. pain is usually at night. pt reports no difference in pain after eating. pt has not tried anything to help with pain     This issue was discussed with pt at her annual health appointment in July.  She is avoiding greasy foods.  She gets pains 2-3 times/week.  Her BMs are normal and regular.  Her Menses are normal and regular.  She is no longer wearing a girdle.  Pt says she got covid vaccination but she doesn't have her card with her today.      Relevant past medical, surgical, family and social history reviewed and updated as indicated. Interim medical history since our last visit reviewed. Allergies and medications reviewed and updated.   Current Outpatient Medications:  .  acyclovir (ZOVIRAX) 400 MG tablet, Take 1 tablet by mouth twice daily, Disp: 60 tablet, Rfl: 11     Review of Systems  Per HPI unless specifically indicated above     Objective:    BP 100/62   Pulse 75   Temp 98 F (36.7 C)   Ht 4' 11.75" (1.518 m)   Wt 177 lb 12.8 oz (80.6 kg)   SpO2 99%   BMI 35.02 kg/m    Wt Readings from Last 3 Encounters:  07/16/20 177 lb 12.8 oz (80.6 kg)  04/27/20 177 lb (80.3 kg)  08/22/18 182 lb (82.6 kg)    Physical Exam Vitals reviewed.  Constitutional:      General: She is not in acute distress.    Appearance: She is well-developed. She is obese. She is not ill-appearing.  HENT:     Head: Normocephalic and atraumatic.  Cardiovascular:     Rate and Rhythm: Normal rate and regular rhythm.  Pulmonary:     Effort: Pulmonary effort is normal.     Breath sounds: Normal breath sounds.  Abdominal:     General: Bowel sounds are normal. There is no distension.     Palpations: Abdomen is soft. There is no hepatomegaly, splenomegaly, mass or pulsatile mass.     Tenderness: There is no abdominal tenderness. There is no guarding or rebound.  Musculoskeletal:     Cervical back: Neck supple.     Right lower leg: No edema.     Left lower leg: No edema.  Lymphadenopathy:     Cervical: No cervical adenopathy.  Skin:    General: Skin is warm and dry.  Neurological:     Mental Status: She is alert and oriented to person, place, and time.  Psychiatric:        Behavior: Behavior normal.            Assessment & Plan:    Encounter Diagnoses  Name Primary?  . Right upper quadrant abdominal pain Yes  . Obesity, unspecified classification, unspecified obesity type, unspecified whether serious comorbidity present   . Not proficient in [place] language      -Order [place] to evaluate GB -Gave cafa/application for [facility] [employer] financial assistance -Gave gallbladder eating plan handout to pt -follow up as scheduled

## 2020-07-16 NOTE — Patient Instructions (Signed)
Plan de alimentacin para problemas de vescula biliar Gallbladder Eating Plan Si tiene una afeccin de la vescula biliar, puede tener problemas para digerir las grasas. Consumir una dieta con bajo contenido de grasas puede [employer] sntomas, y puede ser beneficiosa antes y despus de [place] de extraccin de vescula biliar (colecistectoma). El mdico puede recomendarle que trabaje con un especialista en dietas y alimentacin (nutricionista) para que lo ayude a reducir la cantidad de grasas en su dieta. Consejos para seguir este plan [place]autas generales  Limite el consumo de grasas a menos del 30% del total de caloras diarias. Si usted ingiere alrededor de 1800 caloras diarias, esto es menos de 60 gramos (g) de Automotive engineer.  La grasa es una parte importante de una dieta saludable. Consumir una dieta con bajo contenido de grasas puede dificultar mantener un peso corporal saludable. Pregunte a su nutricionista qu cantidad de grasas, caloras y otros nutrientes necesita diariamente.  Haga comidas pequeas y frecuentes Freight forwarder de tres comidas abundantes.  Beba de 8 a 10 vasos de lquido por [employer]. Beba suficiente lquido como para mantener la orina clara o de color amarillo plido.  Limite el consumo de alcohol a no ms de por da si es mujer y no est [place], y por da si es hombre. Una medida equivale a 12oz ( ) de cerveza, 5oz ( ) de vino o 1oz (86ml) de bebidas alcohlicas de alta graduacin. Lea las etiquetas de los alimentos  Consulte la informacin nutricional en las etiquetas de los alimentos para conocer la cantidad de grasas por porcin. Elija alimentos con menos de 3 gramos de grasas por porcin. De compras  Elija alimentos saludables sin grasas o con bajo contenido de [place]. Busque las palabras "sin grasa", "bajo en grasas" o "con bajo contenido de grasas".  Evite comprar alimentos procesados o  envasados. Coccin  Para cocinar opte por mtodos con bajo contenido de grasa, como hornear, hervir, Software engineer y Transport planner.  Cocine con pequeas cantidades de grasas saludables, como aceite de [place], aceite de semilla de [place], aceite de canola o [place]. Qu alimentos se recomiendan?   Todas las frutas y verduras frescas, congeladas o enlatadas.  Cereales integrales.  Leche y yogur semidescremados y descremados.  Vita [place], aves sin piel, pescado, huevos y legumbres.  Suplementos proteicos con bajo contenido de grasas, en polvo o lquidos.  Hierbas y especias. Qu alimentos no se recomiendan?  Alimentos muy grasos. Entre estos se incluyen productos panificados, comida rpida, cortes de carne con grasa, helados, pan francs, rosquillas dulces, pizza, pan de queso, alimentos cubiertos con [place], salsas con crema o queso.  Comidas fritas. Se incluyen papas fritas, tempura, pescado rebozado, milanesas de pollo, panes fritos y dulces.  Alimentos con [employer].  Alimentos que causan gases o meteorismo. Resumen  Una dieta de bajo contenido graso puede ser beneficiosa si tiene una afeccin de la vescula biliar o puede hacerla antes y despus de someterse a una ciruga de vescula.  Limite el consumo de grasas a menos del 30% del total de caloras diarias. Esto es casi 60 gramos de grasa si usted ingiere 1800 caloras diarias.  Haga comidas pequeas y frecuentes Freight forwarder de tres comidas abundantes. Esta informacin no tiene Theme park manager el consejo del mdico. Asegrese de hacerle al mdico cualquier pregunta que tenga. Document Revised: 05/09/2017 Document Reviewed: 05/09/2017 Elsevier Patient Education  2020 [employer].

## 2020-07-27 ENCOUNTER — Other Ambulatory Visit: Payer: Self-pay

## 2020-07-27 ENCOUNTER — Ambulatory Visit (HOSPITAL_COMMUNITY)
Admission: RE | Admit: 2020-07-27 | Discharge: 2020-07-27 | Disposition: A | Discharge status: Home / Self Care | Payer: Self-pay | Source: Ambulatory Visit | Attending: Physician Assistant | Admitting: Physician Assistant

## 2020-07-27 DIAGNOSIS — R1011 Right upper quadrant pain: Secondary | ICD-10-CM | POA: Insufficient documentation

## 2020-07-30 ENCOUNTER — Encounter: Payer: Self-pay | Admitting: Internal Medicine

## 2020-08-02 ENCOUNTER — Emergency Department (HOSPITAL_COMMUNITY)
Admission: EM | Admit: 2020-08-02 | Discharge: 2020-08-03 | Disposition: A | Discharge status: Home / Self Care | Payer: Self-pay | Attending: Emergency Medicine | Admitting: Emergency Medicine

## 2020-08-02 ENCOUNTER — Encounter (HOSPITAL_COMMUNITY): Payer: Self-pay

## 2020-08-02 DIAGNOSIS — Y92410 Unspecified street and highway as the place of occurrence of the external cause: Secondary | ICD-10-CM | POA: Insufficient documentation

## 2020-08-02 DIAGNOSIS — M791 Myalgia, unspecified site: Secondary | ICD-10-CM | POA: Insufficient documentation

## 2020-08-02 DIAGNOSIS — Y9389 Activity, other specified: Secondary | ICD-10-CM | POA: Insufficient documentation

## 2020-08-02 DIAGNOSIS — M62838 Other muscle spasm: Secondary | ICD-10-CM

## 2020-08-02 DIAGNOSIS — S0990XA Unspecified injury of head, initial encounter: Secondary | ICD-10-CM | POA: Diagnosis not present

## 2020-08-02 DIAGNOSIS — M7918 Myalgia, other site: Secondary | ICD-10-CM
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
## 2020-08-03 ENCOUNTER — Emergency Department (HOSPITAL_COMMUNITY): Payer: Self-pay

## 2020-08-03 MED ORDER — PROCHLORPERAZINE MALEATE 5 MG PO TABS
10.0000 mg | ORAL_TABLET | Freq: Once | ORAL | Status: AC
  Administered 2020-08-03: 10 mg via ORAL
  Filled 2020-08-03: qty 2

## 2020-08-03 MED ORDER — DIPHENHYDRAMINE HCL 25 MG PO CAPS
50.0000 mg | ORAL_CAPSULE | Freq: Once | ORAL | Status: AC
  Administered 2020-08-03: 50 mg via ORAL
  Filled 2020-08-03: qty 2

## 2020-08-03 MED ORDER — ACETAMINOPHEN 325 MG PO TABS
650.0000 mg | ORAL_TABLET | Freq: Once | ORAL | Status: AC
  Administered 2020-08-03: 650 mg via ORAL
  Filled 2020-08-03: qty 2

## 2020-08-03 MED ORDER — CYCLOBENZAPRINE HCL 10 MG PO TABS: 10.0000 mg | ORAL_TABLET | Freq: Two times a day (BID) | ORAL | 0 refills | Status: DC | PRN
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
## 2020-08-03 NOTE — Discharge Instructions (Signed)
Your work-up today was reassuring.  No evidence of fractures.  I suspect you have muscular injury, please use the muscle relaxant to help with your spasms.  Please follow-up with your primary doctor.  Please rest and stay hydrated.  If any symptoms change or worsen, please return to the nearest emergency department.

## 2020-08-13 ENCOUNTER — Ambulatory Visit (INDEPENDENT_AMBULATORY_CARE_PROVIDER_SITE_OTHER): Payer: Self-pay | Admitting: General Surgery

## 2020-08-13 ENCOUNTER — Other Ambulatory Visit: Payer: Self-pay

## 2020-08-13 ENCOUNTER — Encounter: Payer: Self-pay | Admitting: Internal Medicine

## 2020-08-13 ENCOUNTER — Encounter: Payer: Self-pay | Admitting: General Surgery

## 2020-08-13 ENCOUNTER — Ambulatory Visit (INDEPENDENT_AMBULATORY_CARE_PROVIDER_SITE_OTHER): Payer: Self-pay | Admitting: Internal Medicine

## 2020-08-13 VITALS — BP 121/72 | HR 65 | Temp 97.3°F | Ht 59.0 in | Wt 172.2 lb

## 2020-08-13 VITALS — BP 98/53 | HR 76 | Temp 98.3°F | Resp 14 | Ht 59.75 in | Wt 174.0 lb

## 2020-08-13 DIAGNOSIS — K8000 Calculus of gallbladder with acute cholecystitis without obstruction: Secondary | ICD-10-CM

## 2020-08-13 NOTE — Progress Notes (Signed)
[facility] History and Physical  Reason for Referral: Gallstones Referring Physician: Jacquelin Hawking, PA-C  Chief Complaint    New Patient (Initial Visit)      Carol Huff is a 44 y.o. female.  HPI: Carol Huff is a 44 yo who comes in with complaints of a 3 month history of upper abdominal pain and nausea but no vomiting. The pain has been getting worse over this course of time and is intermittent and last about 3 hrs. She has tried reflux medication without any changes in the pain. She has had similar episodes in the past with the worse being in the last month. She does say that greasy food causes her pain and has been avoiding this since her pain started. She had an [place] that demonstrated stones and gallbladder inflammation concerning for cholecystitis at that time. This was several weeks ago. She has not had recent lab work.   She is spanish speaking and this history and physical was taken with the aid of an interpretor.   Past Medical History:  Diagnosis Date  . HSV infection     Past Surgical History:  Procedure Laterality Date  . CESAREAN SECTION    . TUBAL LIGATION      History reviewed. No pertinent family history.  Social History   Tobacco Use  . Smoking status: Never Smoker  . Smokeless tobacco: Never Used  Substance Use Topics  . Alcohol use: Yes    Comment: occasionally  . Drug use: No    Medications: I have reviewed the patient's current medications. Allergies as of 08/13/2020   No Known Allergies     Medication List       Accurate as of August 13, 2020  2:33 PM. If you have any questions, ask your nurse or doctor.        STOP taking these medications   cyclobenzaprine 10 MG tablet Commonly known as: FLEXERIL Stopped by: Carol Roers, MD     TAKE these medications   acyclovir 400 MG tablet Commonly known as: ZOVIRAX Take 1 tablet by mouth twice daily        ROS:  A comprehensive review of  systems was negative except for: Gastrointestinal: positive for abdominal pain and nausea  Blood pressure (!) 98/53, pulse 76, temperature 98.3 F (36.8 C), temperature source Oral, resp. rate 14, height 4' 11.75" (1.518 m), weight 174 lb (78.9 kg), last menstrual period 08/02/2020, SpO2 94 %. Physical Exam Vitals reviewed.  Constitutional:      Appearance: Normal appearance.  HENT:     Head: Normocephalic.     Nose: Nose normal.     Mouth/Throat:     Mouth: Mucous membranes are moist.  Eyes:     Extraocular Movements: Extraocular movements intact.  Cardiovascular:     Rate and Rhythm: Normal rate and regular rhythm.  Pulmonary:     Effort: Pulmonary effort is normal.     Breath sounds: Normal breath sounds.  Abdominal:     Palpations: Abdomen is soft.     Tenderness: There is abdominal tenderness in the right upper quadrant and epigastric area.  Musculoskeletal:        General: Normal range of motion.     Cervical back: Normal range of motion.  Skin:    General: Skin is warm.  Neurological:     General: No focal deficit present.     Mental Status: She is alert and oriented to person, place, and time.  Psychiatric:  Mood and Affect: Mood normal.        Behavior: Behavior normal.        Thought Content: Thought content normal.        Judgment: Judgment normal.     Results: CLINICAL DATA:  Right upper quadrant pain  EXAM: ULTRASOUND ABDOMEN LIMITED RIGHT UPPER QUADRANT  COMPARISON:  None.  FINDINGS: Gallbladder:  16 mm gallstone. Gallbladder wall 3 mm. Pericholecystic fluid. Positive sonographic Murphy sign with pain over the gallbladder.  Common bile duct:  Diameter: 4 mm  Liver:  No focal lesion identified. Within normal limits in parenchymal echogenicity. Portal vein is patent on color Doppler imaging with normal direction of blood flow towards the liver.  Other: None.  IMPRESSION: Cholelithiasis with signs of acute  cholecystitis.  These results will be called to the ordering clinician or representative by the Carol Huff, and communication documented in the PACS or Clario Dashboard.   Electronically Signed   By: Carol  Huff M.D.   On: 07/27/2020 15:46  Assessment & Plan:  Carol Huff is a 43 y.o. female with signs of inflammation and stones on her US. She has not had fevers or signs of infection so it may be chronic inflammation at this point given the chronicity of her symptoms.   -PLAN: I counseled the patient about the indication, risks and benefits of laparoscopic cholecystectomy.  She understands there is a very small chance for bleeding, infection, injury to normal structures (including common bile duct), conversion to open surgery, persistent symptoms, evolution of postcholecystectomy diarrhea, need for secondary interventions, anesthesia reaction, cardiopulmonary issues and other risks not specifically detailed here. I described the expected recovery, the plan for follow-up and the restrictions during the recovery phase.  All questions were answered.  Discussed preoperative COVID testing.  Discussed going to the ED with worsening pain or signs of infection   All questions were answered to the satisfaction of the patient.   Carol Huff 08/13/2020, 2:33 PM  

## 2020-08-13 NOTE — Patient Instructions (Signed)
Colelitiasis Cholelithiasis  La colelitiasis tambin recibe el nombre de "clculos biliares". Es un tipo de enfermedad de la vescula biliar. La vescula biliar es un rgano que almacena un lquido (bilis) que ayuda a Location manager las [place]. Los clculos biliares podran no causar sntomas (clculos biliares silenciosos) hasta provocar una obstruccin; luego, pueden causar dolor (ataque de la vescula biliar). Siga estas indicaciones en su casa:  Tome los medicamentos de venta libre y los recetados solamente como se lo haya indicado el mdico.  Mantenga un peso saludable.  Consuma alimentos saludables. Esto incluye lo siguiente: ? Comer una menor cantidad de alimentos grasos, como los alimentos fritos. ? Comer una menor cantidad de carbohidratos refinados. Los carbohidratos refinados son los panes y los cereales muy procesados, como el pan blanco y el arroz blanco. En cambio, elegir cereales integrales, como el pan integral o el arroz integral. ? Consumir ms fibra. Las [place], las frutas frescas y los frijoles son fuentes saludables de [place].  Concurra a todas las visitas de [address] se lo haya indicado el mdico. Esto es importante. Comunquese con un mdico si:  De repente, siente dolor en el costado superior derecho del vientre (abdomen). El dolor podra extenderse hasta el hombro derecho o el pecho. Estos pueden ser sntomas de un ataque de la vescula biliar.  Siente Programme researcher, broadcasting/film/video (tiene nuseas).  Devuelve (vomita).  Le han diagnosticado clculos biliares que no presentan sntomas y tiene lo siguiente: ? Dolor abdominal. ? Molestias, ardor o sensacin de plenitud en la parte superior del vientre (empacho). Solicite ayuda de inmediato si:  De repente, siente dolor en el costado superior derecho del vientre que dura ms de 2horas.  Tiene dolor abdominal que dura ms de 5horas.  Tiene fiebre o siente escalofros.  Sigue sintiendo nuseas o vomitando.  Nota que  la piel o la parte blanca del ojo estn amarillas (ictericia).  Hace pis (orina) de color oscuro.  Su materia fecal (heces) es de tono muy claro. Resumen  La colelitiasis tambin recibe el nombre de "clculos biliares".  La vescula biliar es un rgano que almacena un lquido (bilis) que ayuda a Location manager las [place].  Los clculos biliares silenciosos son clculos biliares que no causan sntomas.  Un ataque de la vescula biliar podra causar un dolor repentino en el costado superior derecho del vientre. El dolor podra extenderse hasta el hombro derecho o el pecho. Si esto ocurre, comunquese con el mdico.  Si le aparece un dolor repentino en el costado superior derecho del vientre que dura ms de 2horas, busque ayuda de inmediato. Esta informacin no tiene Theme park manager el consejo del mdico. Asegrese de hacerle al mdico cualquier pregunta que tenga. Document Revised: 04/03/2017 Document Reviewed: 03/27/2013 Elsevier Patient Education  2020 Elsevier Inc.   Colecistectoma laparoscpica Laparoscopic Cholecystectomy Una colecistectoma laparoscpica es un procedimiento que se realiza para extirpar la vescula biliar. La vescula biliar es un rgano que tiene forma de pera y se encuentra debajo del hgado, del lado derecho del cuerpo. La vescula biliar almacena bilis, un lquido que ayuda a [employer]. La colecistectoma se realiza con frecuencia debido a la inflamacin de la vescula biliar (colecistitis). Esta afeccin normalmente se debe a la acumulacin de clculos biliares (colelitiasis) en la vescula biliar. Estos clculos pueden obstruir el flujo de la bilis, lo que produce inflamacin y Engineer, mining. En los [employer], podr ser [place]. Este procedimiento se realiza a travs de incisiones pequeas en el abdomen (ciruga laparoscpica).  Se introduce un endoscopio delgado que tiene Secretary/administrator (laparoscopio) a travs de una incisin. A travs de  las otras incisiones, se introducen pequeos instrumentos quirrgicos. En algunos casos, un procedimiento de [place] laparoscpica puede convertirse en un tipo de ciruga que se realiza a travs de una incisin ms grande [place]). Informe al mdico acerca de lo siguiente:  Cualquier alergia que tenga.  Todos los [employer], incluidos vitaminas, hierbas, gotas oftlmicas, cremas y [address] de [address].  Cualquier problema que usted o sus familiares hayan tenido con anestsicos.  Cualquier enfermedad de la sangre que tenga.  Cirugas a las que se someti.  Cualquier afeccin mdica que tenga.  Si est embarazada o podra estarlo. Cules son los riesgos? En general, se trata de un procedimiento seguro. Sin embargo, pueden ocurrir complicaciones, por ejemplo:  Infeccin.  Hemorragia.  Reacciones alrgicas a los medicamentos.  Daos a Systems developer u otros rganos.  Un clculo que queda en el conducto biliar comn (coldoco). El conducto coldoco transporta la bilis desde la vescula biliar hacia el intestino delgado.  Una filtracin de bilis del conducto cstico que se comprime cuando se extirpa la vescula biliar. Medicamentos  Consulte al mdico sobre: ? Multimedia programmer o suspender los medicamentos que toma habitualmente. Esto es muy importante si toma medicamentos para la diabetes o anticoagulantes. ? Tomar medicamentos como aspirina e ibuprofeno. Estos medicamentos pueden tener un efecto anticoagulante en la [place]. No tome estos medicamentos antes del procedimiento si su mdico le indica que no lo haga.  Pueden indicarle un antibitico para ayudar a prevenir infecciones. Instrucciones generales  Infrmele al mdico antes de la ciruga si se ha resfriado o si tiene una infeccin.  Haga que alguien lo lleve a su casa desde el hospital o la clnica.  Pregntele al mdico cmo se Cabin crew o se Audiological scientist de la Leisure centre manager. Qu ocurre durante  el procedimiento?   Para disminuir el riesgo de contraer una infeccin: ? El equipo mdico se lavar o se Administrator, sports. ? Le lavarn la piel con jabn. ? Pueden rasurarle la zona [place].  Pueden colocarle un tubo (catter) intravenoso en una de las venas.  Le administrarn uno o ms de los siguientes medicamentos: ? Un medicamento para ayudarlo a relajarse (sedante). ? Un medicamento que lo har dormir (anestesia general).  Le colocarn un tubo en la boca para que pueda respirar.  Su cirujano le har varios cortes pequeos (incisiones) en el abdomen.  El laparoscopio se introducir a travs de una de las pequeas incisiones. La cmara del laparoscopio enviar imgenes a una pantalla de televisin (monitor) que se encuentra en el quirfano. Esto permitir a su Pensions consultant del abdomen.  Le inyectarn un gas similar al aire en el abdomen. Esto expandir el abdomen para que el cirujano tenga ms lugar para Facilities manager.  El resto del instrumental necesario para el procedimiento se introducir a travs de las otras incisiones. Se extirpar la vescula biliar a travs de una de las incisiones.  Se puede examinar el conducto coldoco. Si se encuentran clculos en el conducto coldoco, tal vez deban extirparse.  Despus de la extirpacin de la vescula biliar, se cerrarn las incisiones con puntos (suturas), grapas o goma para cerrar la piel.  Las incisiones pueden cubrirse con una venda (vendaje). Este procedimiento puede variar segn el mdico y el hospital. Ladell Heads ocurre despus del procedimiento?  Le controlarn la presin arterial, la frecuencia cardaca, la frecuencia respiratoria y Air cabin crew  de oxgeno en la sangre hasta que desaparezca el efecto de los medicamentos administrados.  Le darn analgsicos para Human resources officer, si es necesario.  No conduzca durante 24horas si le administraron un sedante. Esta informacin no tiene Theme park manager el  consejo del mdico. Asegrese de hacerle al mdico cualquier pregunta que tenga. Document Revised: 01/13/2018 Document Reviewed: 03/21/2016 Elsevier Patient Education  2020 [employer].

## 2020-08-14 NOTE — Patient Instructions (Signed)
Carol Huff  08/14/2020     @PREFPERIOPPHARMACY @                                                  Instrucciones Para Antes de la Ciruga   Su ciruga est programada para-(your procedure is scheduled on) 08/19/2020 @ 0615.   13/12[birth year]2019 [place]     Por favor llame al [phone] si tiene algn problema en la maana de la ciruga.                   Recuerde    No coma alimentos ni tome lquidos, incluyendo agua, despus de la medianoche del     [phone] estas medicinas en la maana de la ciruga con un SORBITO de agua  None   Puede cepillarse los dientes en la maana de la [employer].    No use joyas, maquillaje de ojos, lpiz labial, crema para el cuerpo o esmalte de uas oscuro.   No puede usar desodorante.   Si va a ser ingresado despues de la ciruga, deje la [place] en el carro hasta que se le haya asignado una habitacin.    A los pacientes que se les d de alta el mismo da no se les permitir manejar a casa.     Use ropa suelta y cmoda de regreso a casa.         Colecistectoma laparoscpica, cuidados posteriores Laparoscopic Cholecystectomy, Care After Lea esta informacin sobre cmo cuidarse despus del procedimiento. El mdico tambin podr darle indicaciones ms especficas. Si tiene problemas o preguntas, llame al mdico. Siga estas indicaciones en su casa: Cuidado de los cortes de la ciruga (incisiones)   Siga las indicaciones del mdico en lo que respecta al cuidado de los cortes de la [employer]. Haga lo siguiente: ? Lvese las manos con agua y jabn antes de [place] las vendas (vendaje). Use un desinfectante para manos si no dispone de Multimedia programmer y [place]. ? Cambie el vendaje como se lo haya indicado el mdico. ? No retire los puntos (suturas), la goma para cerrar la piel o las tiras [place]. Tal vez deban dejarse puestos en la piel durante 2semanas o ms tiempo. Si las tiras [place] se despegan y se enroscan, puede recortar los  bordes sueltos. No retire las tiras [place] por completo a menos que el mdico lo autorice.  No tome baos de inmersin, no practique natacin ni use el jacuzzi hasta que el mdico lo autorice. Pregntele al mdico si puede ducharse. [employer] solo le permitan tomar baos de [place].  Controle la zona alrededor del corte todos los das para detectar signos de infeccin. Est atento a los siguientes signos: ? Aumento del enrojecimiento, de la hinchazn o del dolor. ? Ms lquido [place]. ? [place]. ? Pus o mal olor. Actividad  No conduzca ni use maquinaria pesada mientras toma analgsicos recetados.  No levante ningn objeto que pese ms de 10libras (4,5kg) hasta que el mdico lo autorice.  No practique deportes de contacto hasta que el mdico lo autorice.  No conduzca durante 24horas si le dieron un medicamento para ayudarlo a que se relaje (sedante).  Descanse todo lo que sea necesario. No retome el trabajo ni el estudio hasta que el mdico lo autorice. Instrucciones generales  [place] de venta [place]  y los recetados solamente como se lo haya indicado el mdico.  A fin de prevenir o tratar el estreimiento mientras toma analgsicos recetados, el mdico puede recomendarle lo siguiente: ? Product manager suficiente lquido para Radio producer pis (orina) claro o de color amarillo plido. ? Tomar medicamentos recetados o de [employer]. ? Consumir alimentos ricos en fibra, como frutas y verduras frescas, cereales integrales y frijoles. ? Limitar el consumo de alimentos con alto contenido de grasas y azcares procesados, como alimentos fritos o dulces. Comunquese con un mdico si:  Le aparece una erupcin cutnea.  Tiene ms enrojecimiento, hinchazn o dolor alrededor [employer] cortes de la [place].  Aumenta la cantidad de lquido o sangre que sale de los cortes.  Los cortes de la ciruga se sienten calientes al tacto.  Tiene pus o percibe mal olor que [employer]  cortes.  Tiene fiebre.  Se abren uno o ms de los cortes. Solicite ayuda de inmediato si:  Tiene dificultad para respirar.  Siente dolor en el pecho.  Siente dolor que empeora en la zona de los hombros.  Se desmaya o se siente mareado al ponerse de pie.  Tiene dolor muy intenso de vientre (abdomen).  Tiene malestar estomacal (nuseas) que dura ms de Civil engineer, contracting.  Vomita durante ms de Civil engineer, contracting.  Siente dolor en la pierna. Esta informacin no tiene Theme park manager el consejo del mdico. Asegrese de hacerle al mdico cualquier pregunta que tenga. Document Revised: 01/09/2017 Document Reviewed: 03/21/2016 Elsevier Patient Education  2020 Elsevier Inc.  Anestesia general en adultos, cuidados posteriores General Anesthesia, Adult, Care After Lea esta informacin sobre cmo cuidarse despus del procedimiento. El mdico tambin podr darle instrucciones ms especficas. Comunquese con su mdico si tiene problemas o preguntas. Qu puedo esperar despus del procedimiento? Luego del procedimiento, son comunes los siguiente efectos secundarios:  Dolor o Associate Professor en el lugar de la va intravenosa (i.v.).  Nuseas.  Vmitos.  Dolor de Advertising copywriter.  Dificultad para concentrarse.  Sentir fro o [employer].  Debilidad o cansancio.  Somnolencia y Management consultant.  Malestar y Tourist information centre manager. Estos efectos secundarios pueden afectar partes del cuerpo que no estuvieron involucradas en la ciruga. Siga estas indicaciones en su casa:  Durante al menos 24horas despus del procedimiento:  Pdale a un adulto responsable que permanezca con usted. Es importante que alguien cuide de usted hasta que se despierte y Statistician.  Descanse todo lo que sea necesario.  No haga lo siguiente: ? Participar en actividades en las que podra caerse o lastimarse. ? Conducir. ? Operar maquinarias pesadas. ? Beber alcohol. ? Tomar somnferos o medicamentos que causen somnolencia. ? Firmar  documentos legales ni tomar [employer] and [employer]ity [employer]. ? Cuidar a nios por su cuenta. Qu debe comer y beber  Siga las indicaciones del mdico respecto de las restricciones de comidas o bebidas.  Cuando [employer], comience a comer cantidades pequeas de alimentos que sean blandos y fciles de Location manager (livianos), como una tostada. Retome su dieta habitual de forma gradual.  Beba suficiente lquido como para mantener la orina de color amarillo plido.  Si vomita, rehidrtese tomando agua, jugo o caldo transparente. Instrucciones generales  Si tiene apnea del sueo, la [place] y ciertos medicamentos pueden aumentar el riesgo de problemas respiratorios. Siga las indicaciones del mdico respecto al uso de su dispositivo para dormir: ? Siempre que duerma, incluso durante las siestas que tome en el da. ? Mientras tome analgsicos recetados, medicamentos para dormir o medicamentos que producen somnolencia.  Reanude sus actividades normales segn lo indicado por el mdico. Pregntele al mdico qu actividades son seguras para usted.  Tome los medicamentos de venta libre y los recetados solamente como se lo haya indicado el mdico.  Si fuma, no lo haga sin supervisin.  Concurra a todas las visitas de [address] se lo haya indicado el mdico. Esto es importante. Comunquese con un mdico si:  Tiene nuseas o vmitos que no mejoran con medicamentos.  No puede comer ni beber sin vomitar.  El dolor no se alivia con medicamentos.  No puede orinar.  Tiene una erupcin cutnea.  Tiene fiebre.  Presenta enrojecimiento alrededor del lugar de la va intravenosa (i.v.) que empeora. Solicite ayuda de inmediato si:  Tiene dificultad para respirar.  Siente dolor en el pecho.  Observa sangre en la orina o heces, o vomita sangre. Resumen  Despus del procedimiento, es comn tener dolor de garganta y nuseas. Tambin es comn sentirse cansado.  Pdale a un adulto responsable que  permanezca con usted durante 24 horas despus de la anestesia general. Es importante que alguien cuide de usted hasta que se despierte y Statistician.  Cuando [employer], comience a comer cantidades pequeas de alimentos que sean blandos y fciles de Location manager (livianos), como una tostada. Retome su dieta habitual de forma gradual.  Beba suficiente lquido como para mantener la orina de color amarillo plido.  Reanude sus actividades normales segn lo indicado por el mdico. Pregntele al mdico qu actividades son seguras para usted. Esta informacin no tiene Theme park manager el consejo del mdico. Asegrese de hacerle al mdico cualquier pregunta que tenga. Document Revised: 07/31/2017 Document Reviewed: 07/31/2017 Elsevier Patient Education  2020 Elsevier Inc. Cmo usar clorhexidina para baarse How to Use Chlorhexidine for Bathing El gluconato de clorhexidina (CHG) es una solucin desinfectante (antisptica) que se utiliza para limpiar la piel. Puede eliminar las bacterias que normalmente viven en la piel y mantenerlas alejadas durante aproximadamente 24 horas. Para limpiarse la piel con CHG, es posible que le den lo siguiente:  Una solucin de CHG para usar en la ducha o como parte de un bao de [place].  Un pao preenvasado que contenga CHG. Limpiar la piel con CHG puede ayudar a disminuir el riesgo de infeccin:  Mientras permanece en la unidad de cuidados intensivos del hospital.  Si tiene un acceso vascular, como una va central, para proporcionar acceso a corto o largo plazo a las venas.  Si tiene un catter para que drene orina de la vejiga.  Si tiene Advertising account planner. El respirador es un aparato que lo ayuda a Industrial/product designer al hacer que el aire entre y salga de sus pulmones.  Despus de [place]. Cules son los riesgos? Los riesgos de usar de CHG incluyen los siguientes:  Reacciones en la piel.  Prdida de la audicin si el CHG ingresa en los odos.  Lesin ocular si el  CHG ingresa en los ojos y no se enjuaga.  El CHG es inflamable. Asegrese de no fumar ni acercarse al fuego luego de aplicarse CHG en la piel. No use CHG:  Si es alrgico a la clorhexidina o anteriormente tuvo una reaccin a la clorhexidina.  En bebs de menos de 2 meses. Cmo usar la solucin de CHG  Use CHG nicamente como se lo indique su mdico y siga las instrucciones de la etiqueta.  Use la cantidad de CHG que le indicaron. Usualmente, la medida es una botella. Durante una ducha Siga estos pasos al usar  la solucin de CHG durante una ducha (a menos que su mdico le brinde instrucciones diferentes): 1. Comience a ducharse. 2. Lvese la cara y el cabello con el jabn y el champ que utiliza habitualmente. 3. Cierre la ducha o salga de abajo del agua. 4. Vierta el CHG en un pao limpio. No use ningn cepillo o esponja con bordes rugosos. 5. Comience en el cuello y colquese la espuma en todo el cuerpo [employer]. Asegrese de seguir estas instrucciones: ? Si le harn una ciruga, preste especial atencin a la parte del cuerpo Dance movement psychotherapist. Frote la zona durante al menos 1 minuto. ? No use el CHG en su cabeza o cara. Si la solucin [employer] odos o los ojos, enjuague bien con [place]. ? Evite la zona genital. ? Evite las zonas de la piel que estn lastimadas o tengan cortes o raspaduras. ? Frote su espalda y [employer]. Asegrese de lavar los pliegues de la piel. 6. Deje actuar la espuma por 1 o 2 minutos, o por el tiempo que le haya indicado el mdico. 7. Enjuguese todo el cuerpo bajo la ducha. Asegrese de enjuagar bien todos los pliegues y hendiduras de la piel. 8. Seque con una toalla limpia. Despus no aplique ninguna sustancia en el cuerpo, como polvo, locin o perfume, excepto que se lo indique el mdico. Solo use las lociones recomendadas por el fabricante. 9. Pngase pijama o ropa limpia. 10. Si es la noche anterior a la ciruga, duerma en  sbanas limpias.  Durante un bao de esponja Siga estos pasos al usar la solucin de CHG durante un bao de [place] (a menos que su mdico le brinde instrucciones diferentes): 1. Lvese la cara y el cabello con el jabn y el champ que utiliza habitualmente. 2. Vierta el CHG en un pao limpio. 3. Comience en el cuello y colquese la espuma en todo el cuerpo [employer]. Asegrese de seguir estas instrucciones: ? Si le harn una ciruga, preste especial atencin a la parte del cuerpo Dance movement psychotherapist. Frote la zona durante al menos 1 minuto. ? No use el CHG en su cabeza o cara. Si la solucin [employer] odos o los ojos, enjuague bien con [place]. ? Evite la zona genital. ? Evite las zonas de la piel que estn lastimadas o tengan cortes o raspaduras. ? Frote su espalda y [employer]. Asegrese de lavar los pliegues de la piel. 4. Deje actuar la espuma por 1 o 2 minutos, o por el tiempo que le haya indicado el mdico. 5. Con un pao limpio y hmedo diferente, enjuguese bien todo el cuerpo. Asegrese de enjuagar bien todos los pliegues y hendiduras de la piel. 6. Seque con una toalla limpia. Despus no aplique ninguna sustancia en el cuerpo, como polvo, locin o perfume, excepto que se lo indique el mdico. Solo use las lociones recomendadas por el fabricante. 7. Pngase pijama o ropa limpia. 8. Si es la noche anterior a la ciruga, duerma en sbanas limpias. [employer] paos preenvasados de CHG  Use los paos de CHG nicamente como se lo haya indicado el mdico y siga las instrucciones de la etiqueta.  Use los paos de CHG sobre la piel limpia y [place].  No use el pao de CHG en la cabeza o el rostro a menos que el mdico se lo indique.  Cuando lave con el pao de CHG: ? Evite la zona genital. ?  Evite las zonas de la piel que estn lastimadas o tengan cortes o raspaduras. Antes de la ciruga Siga estos pasos al usar un pao de CHG para limpiar antes de una  ciruga (a menos que su mdico le brinde instrucciones diferentes): 1. Con el pao de CHG, frtese con firmeza la parte del cuerpo donde le realizarn la ciruga. Frtese de un lado al otro durante 3 minutos. El rea del cuerpo debe estar completamente hmeda con CHG cuando termine de frotar. 2. No enjuague. Deseche el pao y deje que el rea se seque al aire. Despus no aplique ninguna sustancia sobre el rea, como polvos, lociones o perfume. 3. Pngase pijama o ropa limpia. 4. Si es la noche anterior a la ciruga, duerma en sbanas limpias.  Para baarse en general Siga estos pasos al usar el pao de CHG para baarse en general (a menos que su mdico le brinde instrucciones diferentes). 1. Use un pao de CHG diferente en cada rea del cuerpo. Asegrese de Foot Lockerlavarse entre los pliegues de la piel y [place] los dedos de las manos y de los pies. Lvese el cuerpo en el siguiente orden, usando un pao nuevo despus de cada paso: ? La parte delantera del cuello, los hombros y Maeserel pecho. ? Ambos brazos, debajo de los brazos y [place] manos. ? El estmago y la zona de la ingle, evitando los genitales. ? La pierna y el pie derechos. ? La pierna y el pie izquierdos. ? La parte posterior del cuello, la espalda y las nalgas. 2. No enjuague. Deseche el pao y deje que el rea se seque al aire. Despus no aplique ninguna sustancia en el cuerpo, como polvo, locin o perfume, excepto que se lo indique el mdico. Solo use las lociones recomendadas por el fabricante. 3. Pngase pijama o ropa limpia. Comunquese con un mdico si:  La piel se irrita despus de frotarla.  Tiene preguntas sobre cmo usar la solucin o el pao. Solicite ayuda inmediatamente si:  Los ojos se ponen muy rojos o Museum/gallery curatorse le hinchan.  Siente picazn intensa en los ojos.  Siente picazn intensa en la piel y est roja o hinchada.  Nota cambios en su audicin.  Tiene dificultad para ver.  Tiene hinchazn u hormigueo en la garganta o la  boca.  Tiene dificultad para respirar.  Traga un poco de clorhexidina. Resumen  El gluconato de clorhexidina (CHG) es una solucin desinfectante (antisptica) que se utiliza para limpiar la piel. Limpiar la piel con CHG puede ayudar a disminuir el riesgo de infeccin.  Es posible que le indiquen que utilice CHG para baarse. Esta solucin puede venir en botella o en un pao preenvasado para que use sobre su piel. Siga cuidadosamente las instrucciones del mdico y las instrucciones que se encuentran en la etiqueta del producto.  No use CHG si es alrgico a la clorhexidina.  Pngase en contacto con su mdico si se le irrita la piel luego de frotar. Esta informacin no tiene Theme park managercomo fin reemplazar el consejo del mdico. Asegrese de hacerle al mdico cualquier pregunta que tenga. Document Revised: 01/22/2019 Document Reviewed: 11/04/2017 Elsevier Patient Education  2020 [employer]vier Inc.

## 2020-08-14 NOTE — H&P (Signed)
[facility] History and Physical  Reason for Referral: Gallstones Referring Physician: Jacquelin Hawking, PA-C  Chief Complaint    New Patient (Initial Visit)      Carol Huff is a 44 y.o. female.  HPI: Carol Huff is a 44 yo who comes in with complaints of a 3 month history of upper abdominal pain and nausea but no vomiting. The pain has been getting worse over this course of time and is intermittent and last about 3 hrs. She has tried reflux medication without any changes in the pain. She has had similar episodes in the past with the worse being in the last month. She does say that greasy food causes her pain and has been avoiding this since her pain started. She had an [place] that demonstrated stones and gallbladder inflammation concerning for cholecystitis at that time. This was several weeks ago. She has not had recent lab work.   She is spanish speaking and this history and physical was taken with the aid of an interpretor.   Past Medical History:  Diagnosis Date  . HSV infection     Past Surgical History:  Procedure Laterality Date  . CESAREAN SECTION    . TUBAL LIGATION      History reviewed. No pertinent family history.  Social History   Tobacco Use  . Smoking status: Never Smoker  . Smokeless tobacco: Never Used  Substance Use Topics  . Alcohol use: Yes    Comment: occasionally  . Drug use: No    Medications: I have reviewed the patient's current medications. Allergies as of 08/13/2020   No Known Allergies     Medication List       Accurate as of August 13, 2020  2:33 PM. If you have any questions, ask your nurse or doctor.        STOP taking these medications   cyclobenzaprine 10 MG tablet Commonly known as: FLEXERIL Stopped by: [name], MD     TAKE these medications   acyclovir 400 MG tablet Commonly known as: ZOVIRAX Take 1 tablet by mouth twice daily        ROS:  A comprehensive review of  systems was negative except for: Gastrointestinal: positive for abdominal pain and nausea  Blood pressure (!) 98/53, pulse 76, temperature 98.3 F (36.8 C), temperature source Oral, resp. rate 14, height 4' 11.75" (1.518 m), weight 174 lb (78.9 kg), last menstrual period 08/02/2020, SpO2 94 %. Physical Exam Vitals reviewed.  Constitutional:      Appearance: Normal appearance.  HENT:     Head: Normocephalic.     Nose: Nose normal.     Mouth/Throat:     Mouth: Mucous membranes are moist.  Eyes:     Extraocular Movements: Extraocular movements intact.  Cardiovascular:     Rate and Rhythm: Normal rate and regular rhythm.  Pulmonary:     Effort: Pulmonary effort is normal.     Breath sounds: Normal breath sounds.  Abdominal:     Palpations: Abdomen is soft.     Tenderness: There is abdominal tenderness in the right upper quadrant and epigastric area.  Musculoskeletal:        General: Normal range of motion.     Cervical back: Normal range of motion.  Skin:    General: Skin is warm.  Neurological:     General: No focal deficit present.     Mental Status: She is alert and oriented to person, place, and time.  Psychiatric:  Mood and Affect: Mood normal.        Behavior: Behavior normal.        Thought Content: Thought content normal.        Judgment: Judgment normal.     Results: CLINICAL DATA:  Right upper quadrant pain  EXAM: ULTRASOUND ABDOMEN LIMITED RIGHT UPPER QUADRANT  COMPARISON:  None.  FINDINGS: Gallbladder:  16 mm gallstone. Gallbladder wall 3 mm. Pericholecystic fluid. Positive sonographic Murphy sign with pain over the gallbladder.  Common bile duct:  Diameter: 4 mm  Liver:  No focal lesion identified. Within normal limits in parenchymal echogenicity. Portal vein is patent on color Doppler imaging with normal direction of blood flow towards the liver.  Other: None.  IMPRESSION: Cholelithiasis with signs of acute  cholecystitis.  These results will be called to the ordering clinician or representative by the Radiologist Assistant, and communication documented in the PACS or [employer].   Electronically Signed   By: Marlan Palau M.D.   On: 07/27/2020 15:46  Assessment & Plan:  Carol Huff is a 44 y.o. female with signs of inflammation and stones on her [place]. She has not had fevers or signs of infection so it may be chronic inflammation at this point given the chronicity of her symptoms.   -PLAN: I counseled the patient about the indication, risks and benefits of laparoscopic cholecystectomy.  She understands there is a very small chance for bleeding, infection, injury to normal structures (including common bile duct), conversion to open surgery, persistent symptoms, evolution of postcholecystectomy diarrhea, need for secondary interventions, anesthesia reaction, cardiopulmonary issues and other risks not specifically detailed here. I described the expected recovery, the plan for follow-up and the restrictions during the recovery phase.  All questions were answered.  Discussed preoperative COVID testing.  Discussed going to the ED with worsening pain or signs of infection   All questions were answered to the satisfaction of the patient.   [name] 08/13/2020, 2:33 PM

## 2020-08-17 ENCOUNTER — Encounter (HOSPITAL_COMMUNITY): Payer: Self-pay

## 2020-08-17 ENCOUNTER — Other Ambulatory Visit: Payer: Self-pay

## 2020-08-17 ENCOUNTER — Other Ambulatory Visit (HOSPITAL_COMMUNITY)
Admission: RE | Admit: 2020-08-17 | Discharge: 2020-08-17 | Disposition: A | Discharge status: Home / Self Care | Payer: Self-pay | Source: Ambulatory Visit | Attending: General Surgery | Admitting: General Surgery

## 2020-08-17 ENCOUNTER — Encounter (HOSPITAL_COMMUNITY)
Admission: RE | Admit: 2020-08-17 | Discharge: 2020-08-17 | Disposition: A | Discharge status: Home / Self Care | Payer: Self-pay | Source: Ambulatory Visit | Attending: General Surgery | Admitting: General Surgery

## 2020-08-17 DIAGNOSIS — Z01818 Encounter for other preprocedural examination: Secondary | ICD-10-CM | POA: Insufficient documentation

## 2020-08-17 DIAGNOSIS — Z20822 Contact with and (suspected) exposure to covid-19: Secondary | ICD-10-CM | POA: Insufficient documentation

## 2020-08-17 LAB — CBC WITH DIFFERENTIAL/PLATELET
Abs Immature Granulocytes: 0.01 10*3/uL (ref 0.00–0.07)
Basophils Absolute: 0 10*3/uL (ref 0.0–0.1)
Basophils Relative: 0 %
Eosinophils Absolute: 0.1 10*3/uL (ref 0.0–0.5)
Eosinophils Relative: 2 %
HCT: 36.8 % (ref 36.0–46.0)
Hemoglobin: 11.6 g/dL — ABNORMAL LOW (ref 12.0–15.0)
Immature Granulocytes: 0 %
Lymphocytes Relative: 32 %
Lymphs Abs: 1.8 10*3/uL (ref 0.7–4.0)
MCH: 25.7 pg — ABNORMAL LOW (ref 26.0–34.0)
MCHC: 31.5 g/dL (ref 30.0–36.0)
MCV: 81.6 fL (ref 80.0–100.0)
Monocytes Absolute: 0.5 10*3/uL (ref 0.1–1.0)
Monocytes Relative: 8 %
Neutro Abs: 3.2 10*3/uL (ref 1.7–7.7)
Neutrophils Relative %: 58 %
Platelets: 316 10*3/uL (ref 150–400)
RBC: 4.51 MIL/uL (ref 3.87–5.11)
RDW: 15.7 % — ABNORMAL HIGH (ref 11.5–15.5)
WBC: 5.6 10*3/uL (ref 4.0–10.5)
nRBC: 0 % (ref 0.0–0.2)

## 2020-08-17 LAB — COMPREHENSIVE METABOLIC PANEL
ALT: 18 U/L (ref 0–44)
AST: 18 U/L (ref 15–41)
Albumin: 3.6 g/dL (ref 3.5–5.0)
Alkaline Phosphatase: 58 U/L (ref 38–126)
Anion gap: 7 (ref 5–15)
BUN: 13 mg/dL (ref 6–20)
CO2: 23 mmol/L (ref 22–32)
Calcium: 8.6 mg/dL — ABNORMAL LOW (ref 8.9–10.3)
Chloride: 104 mmol/L (ref 98–111)
Creatinine, Ser: 0.67 mg/dL (ref 0.44–1.00)
GFR, Estimated: >60 mL/min (ref >60)
Glucose, Bld: 103 mg/dL — ABNORMAL HIGH (ref 70–99)
Potassium: 4 mmol/L (ref 3.5–5.1)
Sodium: 134 mmol/L — ABNORMAL LOW (ref 135–145)
Total Bilirubin: 0.9 mg/dL (ref 0.3–1.2)
Total Protein: 7 g/dL (ref 6.5–8.1)

## 2020-08-17 LAB — SARS CORONAVIRUS 2 (TAT 6-24 HRS): SARS Coronavirus 2: NEGATIVE

## 2020-08-19 ENCOUNTER — Encounter (HOSPITAL_COMMUNITY): Payer: Self-pay | Admitting: General Surgery

## 2020-08-19 ENCOUNTER — Ambulatory Visit (HOSPITAL_COMMUNITY): Payer: Self-pay | Admitting: Certified Registered"

## 2020-08-19 ENCOUNTER — Encounter (HOSPITAL_COMMUNITY)
Admission: RE | Disposition: A | Discharge status: Home / Self Care | Payer: Self-pay | Source: Home / Self Care | Attending: General Surgery

## 2020-08-19 ENCOUNTER — Ambulatory Visit (HOSPITAL_COMMUNITY)
Admission: RE | Admit: 2020-08-19 | Discharge: 2020-08-19 | Disposition: A | Discharge status: Home / Self Care | Payer: Self-pay | Attending: General Surgery | Admitting: General Surgery

## 2020-08-19 DIAGNOSIS — K8000 Calculus of gallbladder with acute cholecystitis without obstruction: Secondary | ICD-10-CM | POA: Diagnosis present

## 2020-08-19 DIAGNOSIS — K8010 Calculus of gallbladder with chronic cholecystitis without obstruction: Secondary | ICD-10-CM | POA: Insufficient documentation

## 2020-08-19 HISTORY — PX: CHOLECYSTECTOMY: SHX55

## 2020-08-19 LAB — PREGNANCY, URINE: Preg Test, Ur: NEGATIVE

## 2020-08-19 SURGERY — LAPAROSCOPIC CHOLECYSTECTOMY
Anesthesia: General

## 2020-08-19 MED ORDER — BUPIVACAINE HCL (PF) 0.5 % IJ SOLN
INTRAMUSCULAR | Status: DC | PRN
  Administered 2020-08-19: 10 mL

## 2020-08-19 MED ORDER — BUPIVACAINE HCL (PF) 0.5 % IJ SOLN
INTRAMUSCULAR | Status: AC
  Filled 2020-08-19: qty 30

## 2020-08-19 MED ORDER — ROCURONIUM BROMIDE 10 MG/ML (PF) SYRINGE
PREFILLED_SYRINGE | INTRAVENOUS | Status: AC
  Filled 2020-08-19: qty 10

## 2020-08-19 MED ORDER — MIDAZOLAM HCL 2 MG/2ML IJ SOLN
INTRAMUSCULAR | Status: AC
  Filled 2020-08-19: qty 2

## 2020-08-19 MED ORDER — LIDOCAINE 2% (20 MG/ML) 5 ML SYRINGE
INTRAMUSCULAR | Status: AC
  Filled 2020-08-19: qty 10

## 2020-08-19 MED ORDER — SODIUM CHLORIDE 0.9 % IR SOLN
Status: DC | PRN
  Administered 2020-08-19: 1000 mL

## 2020-08-19 MED ORDER — CHLORHEXIDINE GLUCONATE CLOTH 2 % EX PADS: 6.0000 | MEDICATED_PAD | Freq: Once | CUTANEOUS | Status: DC

## 2020-08-19 MED ORDER — FENTANYL CITRATE (PF) 250 MCG/5ML IJ SOLN
INTRAMUSCULAR | Status: AC
  Filled 2020-08-19: qty 5

## 2020-08-19 MED ORDER — DEXAMETHASONE SODIUM PHOSPHATE 10 MG/ML IJ SOLN
INTRAMUSCULAR | Status: DC | PRN
  Administered 2020-08-19: 10 mg via INTRAVENOUS

## 2020-08-19 MED ORDER — DOCUSATE SODIUM 100 MG PO CAPS: 100.0000 mg | ORAL_CAPSULE | Freq: Two times a day (BID) | ORAL | 0 refills | Status: AC | PRN

## 2020-08-19 MED ORDER — HYDROMORPHONE HCL 1 MG/ML IJ SOLN
0.2500 mg | INTRAMUSCULAR | Status: DC | PRN
  Administered 2020-08-19 (×2): 0.5 mg via INTRAVENOUS
  Filled 2020-08-19 (×2): qty 0.5

## 2020-08-19 MED ORDER — LIDOCAINE 2% (20 MG/ML) 5 ML SYRINGE
INTRAMUSCULAR | Status: DC | PRN
  Administered 2020-08-19: 100 mg via INTRAVENOUS

## 2020-08-19 MED ORDER — ONDANSETRON HCL 4 MG PO TABS: 4.0000 mg | ORAL_TABLET | Freq: Three times a day (TID) | ORAL | 1 refills | Status: AC | PRN

## 2020-08-19 MED ORDER — DEXAMETHASONE SODIUM PHOSPHATE 10 MG/ML IJ SOLN
INTRAMUSCULAR | Status: AC
  Filled 2020-08-19: qty 1

## 2020-08-19 MED ORDER — ONDANSETRON HCL 4 MG/2ML IJ SOLN
INTRAMUSCULAR | Status: AC
  Filled 2020-08-19: qty 2

## 2020-08-19 MED ORDER — PROPOFOL 10 MG/ML IV BOLUS
INTRAVENOUS | Status: AC
  Filled 2020-08-19: qty 20

## 2020-08-19 MED ORDER — OXYCODONE HCL 5 MG PO TABS
5.0000 mg | ORAL_TABLET | ORAL | 0 refills | Status: AC | PRN
Start: 2020-08-19 — End: 2021-08-19

## 2020-08-19 MED ORDER — SODIUM CHLORIDE 0.9 % IV SOLN
2.0000 g | INTRAVENOUS | Status: AC
  Administered 2020-08-19: 2 g via INTRAVENOUS
  Filled 2020-08-19: qty 2

## 2020-08-19 MED ORDER — ONDANSETRON HCL 4 MG/2ML IJ SOLN
4.0000 mg | Freq: Once | INTRAMUSCULAR | Status: DC | PRN
  Filled 2020-08-19: qty 2

## 2020-08-19 MED ORDER — ONDANSETRON HCL 4 MG/2ML IJ SOLN
INTRAMUSCULAR | Status: DC | PRN
  Administered 2020-08-19: 4 mg via INTRAVENOUS

## 2020-08-19 MED ORDER — LACTATED RINGERS IV SOLN: INTRAVENOUS | Status: DC | PRN

## 2020-08-19 MED ORDER — SUCCINYLCHOLINE CHLORIDE 20 MG/ML IJ SOLN
INTRAMUSCULAR | Status: DC | PRN
  Administered 2020-08-19: 120 mg via INTRAVENOUS

## 2020-08-19 MED ORDER — ROCURONIUM BROMIDE 10 MG/ML (PF) SYRINGE
PREFILLED_SYRINGE | INTRAVENOUS | Status: DC | PRN
  Administered 2020-08-19: 10 mg via INTRAVENOUS
  Administered 2020-08-19: 30 mg via INTRAVENOUS

## 2020-08-19 MED ORDER — PHENYLEPHRINE HCL (PRESSORS) 10 MG/ML IV SOLN
INTRAVENOUS | Status: DC | PRN
  Administered 2020-08-19: 80 ug via INTRAVENOUS

## 2020-08-19 MED ORDER — PROPOFOL 10 MG/ML IV BOLUS
INTRAVENOUS | Status: DC | PRN
  Administered 2020-08-19: 150 mg via INTRAVENOUS

## 2020-08-19 MED ORDER — SUCCINYLCHOLINE CHLORIDE 200 MG/10ML IV SOSY
PREFILLED_SYRINGE | INTRAVENOUS | Status: AC
  Filled 2020-08-19: qty 10

## 2020-08-19 MED ORDER — MIDAZOLAM HCL 5 MG/5ML IJ SOLN
INTRAMUSCULAR | Status: DC | PRN
  Administered 2020-08-19: 2 mg via INTRAVENOUS

## 2020-08-19 MED ORDER — FENTANYL CITRATE (PF) 100 MCG/2ML IJ SOLN
INTRAMUSCULAR | Status: DC | PRN
  Administered 2020-08-19: 100 ug via INTRAVENOUS
  Administered 2020-08-19 (×3): 50 ug via INTRAVENOUS

## 2020-08-19 MED ORDER — PHENYLEPHRINE 40 MCG/ML (10ML) SYRINGE FOR IV PUSH (FOR BLOOD PRESSURE SUPPORT)
PREFILLED_SYRINGE | INTRAVENOUS | Status: AC
  Filled 2020-08-19: qty 10

## 2020-08-19 MED ORDER — SUGAMMADEX SODIUM 200 MG/2ML IV SOLN
INTRAVENOUS | Status: DC | PRN
  Administered 2020-08-19: 200 mg via INTRAVENOUS

## 2020-08-19 SURGICAL SUPPLY — 46 items
ADH SKN CLS APL DERMABOND .7 (GAUZE/BANDAGES/DRESSINGS) ×1
APL PRP STRL LF DISP 70% ISPRP (MISCELLANEOUS) ×1
APPLIER CLIP ROT 10 11.4 M/L (STAPLE) ×3
APR CLP MED LRG 11.4X10 (STAPLE) ×1
BAG RETRIEVAL 10 (BASKET) ×1
BAG RETRIEVAL 10MM (BASKET) ×1
BLADE SURG 15 STRL LF DISP TIS (BLADE) ×1 IMPLANT
BLADE SURG 15 STRL SS (BLADE) ×3
CHLORAPREP W/TINT 26 (MISCELLANEOUS) ×3 IMPLANT
CLIP APPLIE ROT 10 11.4 M/L (STAPLE) ×1 IMPLANT
CLOTH BEACON ORANGE TIMEOUT ST (SAFETY) ×3 IMPLANT
COVER LIGHT HANDLE STERIS (MISCELLANEOUS) ×6 IMPLANT
COVER WAND RF STERILE (DRAPES) ×3 IMPLANT
DECANTER SPIKE VIAL GLASS SM (MISCELLANEOUS) ×3 IMPLANT
DERMABOND ADVANCED (GAUZE/BANDAGES/DRESSINGS) ×2
DERMABOND ADVANCED .7 DNX12 (GAUZE/BANDAGES/DRESSINGS) ×1 IMPLANT
ELECT REM PT RETURN 9FT ADLT (ELECTROSURGICAL) ×3
ELECTRODE REM PT RTRN 9FT ADLT (ELECTROSURGICAL) ×1 IMPLANT
GLOVE BIO SURGEON STRL SZ 6.5 (GLOVE) ×2 IMPLANT
GLOVE BIO SURGEONS STRL SZ 6.5 (GLOVE) ×1
GLOVE BIOGEL PI IND STRL 6.5 (GLOVE) ×1 IMPLANT
GLOVE BIOGEL PI IND STRL 7.0 (GLOVE) ×3 IMPLANT
GLOVE BIOGEL PI INDICATOR 6.5 (GLOVE) ×2
GLOVE BIOGEL PI INDICATOR 7.0 (GLOVE) ×6
GOWN STRL REUS W/TWL LRG LVL3 (GOWN DISPOSABLE) ×9 IMPLANT
HEMOSTAT SNOW SURGICEL 2X4 (HEMOSTASIS) ×3 IMPLANT
INST SET LAPROSCOPIC AP (KITS) ×3 IMPLANT
KIT TURNOVER KIT A (KITS) ×3 IMPLANT
MANIFOLD NEPTUNE II (INSTRUMENTS) ×3 IMPLANT
NEEDLE INSUFFLATION 14GA 120MM (NEEDLE) ×3 IMPLANT
NS IRRIG 1000ML POUR BTL (IV SOLUTION) ×3 IMPLANT
PACK LAP CHOLE LZT030E (CUSTOM PROCEDURE TRAY) ×3 IMPLANT
PAD ARMBOARD 7.5X6 YLW CONV (MISCELLANEOUS) ×3 IMPLANT
SET BASIN LINEN APH (SET/KITS/TRAYS/PACK) ×3 IMPLANT
SET TUBE SMOKE EVAC HIGH FLOW (TUBING) ×3 IMPLANT
SLEEVE ENDOPATH XCEL 5M (ENDOMECHANICALS) ×3 IMPLANT
SUT MNCRL AB 4-0 PS2 18 (SUTURE) ×6 IMPLANT
SUT VICRYL 0 UR6 27IN ABS (SUTURE) ×3 IMPLANT
SYS BAG RETRIEVAL 10MM (BASKET) ×1
SYSTEM BAG RETRIEVAL 10MM (BASKET) ×1 IMPLANT
TROCAR ENDO BLADELESS 11MM (ENDOMECHANICALS) ×3 IMPLANT
TROCAR XCEL NON-BLD 5MMX100MML (ENDOMECHANICALS) ×3 IMPLANT
TROCAR XCEL UNIV SLVE 11M 100M (ENDOMECHANICALS) ×3 IMPLANT
TUBE CONNECTING 12'X1/4 (SUCTIONS) ×1
TUBE CONNECTING 12X1/4 (SUCTIONS) ×2 IMPLANT
WARMER LAPAROSCOPE (MISCELLANEOUS) ×3 IMPLANT

## 2020-08-19 NOTE — Anesthesia Procedure Notes (Signed)
Procedure Name: Intubation Performed by: Ellorie Kindall L, CRNA Pre-anesthesia Checklist: Patient identified, Emergency Drugs available, Suction available, Patient being monitored and Timeout performed Patient Re-evaluated:Patient Re-evaluated prior to induction Oxygen Delivery Method: Circle system utilized Preoxygenation: Pre-oxygenation with 100% oxygen Induction Type: IV induction Laryngoscope Size: Miller and 2 Grade View: Grade II Tube size: 7.0 mm Number of attempts: 1 Airway Equipment and Method: Stylet Placement Confirmation: ETT inserted through vocal cords under direct vision,  positive ETCO2,  CO2 detector and breath sounds checked- equal and bilateral Secured at: 21 cm Tube secured with: Tape Dental Injury: Teeth and Oropharynx as per pre-operative assessment        

## 2020-08-19 NOTE — Transfer of Care (Signed)
Immediate Anesthesia Transfer of Care Note  Patient: Carol Huff  Procedure(s) Performed: LAPAROSCOPIC CHOLECYSTECTOMY (N/A )  Patient Location: PACU  Anesthesia Type:General  Level of Consciousness: awake, alert , oriented and patient cooperative  Airway & Oxygen Therapy: Patient Spontanous Breathing and Patient connected to nasal cannula oxygen  Post-op Assessment: Report given to RN, Post -op Vital signs reviewed and stable and Patient moving all extremities  Post vital signs: Reviewed and stable  Last Vitals:  Vitals Value Taken Time  BP 108/69 08/19/20 0837  Temp    Pulse 74 08/19/20 0840  Resp    SpO2 98 % 08/19/20 0840  Vitals shown include unvalidated device data.  Last Pain:  Vitals:   08/19/20 0655  TempSrc: Oral  PainSc: 0-No pain      Patients Stated Pain Goal: 7 (08/19/20 0865)  Complications: No complications documented.

## 2020-08-19 NOTE — Anesthesia Preprocedure Evaluation (Signed)
Anesthesia Evaluation  Patient identified by MRN, date of birth, ID band Patient awake    Reviewed: Allergy & Precautions, H&P , NPO status , Patient's Chart, lab work & pertinent test results, reviewed documented beta blocker date and time   Airway Mallampati: II  TM Distance: >3 FB Neck ROM: full    Dental no notable dental hx.    Pulmonary neg pulmonary ROS,    Pulmonary exam normal breath sounds clear to auscultation       Cardiovascular Exercise Tolerance: Good negative cardio ROS   Rhythm:regular Rate:Normal     Neuro/Psych negative neurological ROS  negative psych ROS   GI/Hepatic negative GI ROS, Neg liver ROS,   Endo/Other  Morbid obesity  Renal/GU negative Renal ROS  negative genitourinary   Musculoskeletal   Abdominal   Peds  Hematology negative hematology ROS (+)   Anesthesia Other Findings   Reproductive/Obstetrics negative OB ROS                             Anesthesia Physical Anesthesia Plan  ASA: II  Anesthesia Plan: General   Post-op Pain Management:    Induction:   PONV Risk Score and Plan: Ondansetron  Airway Management Planned:   Additional Equipment:   Intra-op Plan:   Post-operative Plan:   Informed Consent: I have reviewed the patients History and Physical, chart, labs and discussed the procedure including the risks, benefits and alternatives for the proposed anesthesia with the patient or authorized representative who has indicated his/her understanding and acceptance.     Dental Advisory Given  Plan Discussed with: CRNA  Anesthesia Plan Comments:         Anesthesia Quick Evaluation

## 2020-08-19 NOTE — Interval H&P Note (Signed)
History and Physical Interval Note:  08/19/2020 7:32 AM  Carol Huff  has presented today for surgery, with the diagnosis of Cholelithiasis.  The various methods of treatment have been discussed with the patient and family. After consideration of risks, benefits and other options for treatment, the patient has consented to  Procedure(s): LAPAROSCOPIC CHOLECYSTECTOMY (N/A) as a surgical intervention.  The patient's history has been reviewed, patient examined, no change in status, stable for surgery.  I have reviewed the patient's chart and labs.  Questions were answered to the patient's satisfaction.    No questions.  [name]

## 2020-08-19 NOTE — Progress Notes (Signed)
4 port sites with skin glue,clean, dry & intact, no drng

## 2020-08-19 NOTE — Op Note (Signed)
Operative Note   Preoperative Diagnosis: Symptomatic cholelithiasis   Postoperative Diagnosis: Same   Procedure(s) Performed: Laparoscopic cholecystectomy   Surgeon: Lillia Abed C. Henreitta Leber, MD   Assistants: Franky Macho, MD    Anesthesia: General endotracheal   Anesthesiologist: Dr. Johnnette Litter    Specimens: Gallbladder    Estimated Blood Loss: Minimal    Blood Replacement: None    Complications: None    Operative Findings: Gallbladder with stones    Procedure: The patient was taken to the operating room and placed supine. General endotracheal anesthesia was induced. Intravenous antibiotics were administered per protocol. An orogastric tube positioned to decompress the stomach. The abdomen was prepared and draped in the usual sterile fashion.    A supraumbilical incision was made and a Veress technique was utilized to achieve pneumoperitoneum to 15 mmHg with carbon dioxide. A 11 mm optiview port was placed through the supraumbilical region, and a 10 mm 0-degree operative laparoscope was introduced. The area underlying the trocar and Veress needle were inspected and without evidence of injury.  Remaining trocars were placed under direct vision. Two 5 mm ports were placed in the right abdomen, between the anterior axillary and midclavicular line.  A final 11 mm port was placed through the mid-epigastrium, near the falciform ligament.    The gallbladder fundus was elevated cephalad and the infundibulum was retracted to the patient's right. The gallbladder/cystic duct junction was skeletonized. The cystic artery noted in the triangle of Calot and was also skeletonized.  We then continued liberal medial and lateral dissection until the critical view of safety was achieved.    The cystic duct was triply clipped and cystic artery were doubly clipped and both were divided. The gallbladder was then dissected from the liver bed with electrocautery. There was a posterior artery in the mesentery that was  clipped and burned.  The specimen was placed in an Endopouch and was retrieved through the epigastric site.   Final inspection revealed acceptable hemostasis. Surgical SNOW was placed in the gallbladder bed.  Trocars were removed and pneumoperitoneum was released.  0 Vicryl fascial sutures were used to close the epigastric and umbilical port sites. Skin incisions were closed with 4-0 Monocryl subcuticular sutures and Dermabond. The patient was awakened from anesthesia and extubated without complication.    Algis Greenhouse, MD [facility] [address] [place], [place] [address] [phone] (office)

## 2020-08-19 NOTE — Progress Notes (Signed)
[facility]  Spoke with husband. Notified surgery completed and Rx sent to pharmacy. Will do a post operative phone call in a few weeks.  Algis Greenhouse, MD [facility] [address] [place], [place] [address] [phone] (office)

## 2020-08-19 NOTE — Discharge Instructions (Signed)
Discharge Laparoscopic Surgery Instructions:  Common Complaints: Right shoulder pain is common after laparoscopic surgery. This is secondary to the gas used in the surgery being trapped under the diaphragm.  Walk to help your body absorb the gas. This will improve in a few days. Pain at the port sites are common, especially the larger port sites. This will improve with time.  Some nausea is common and poor appetite. The main goal is to stay hydrated the first few days after surgery.   Diet/ Activity: Diet as tolerated. You may not have an appetite, but it is important to stay hydrated. Drink 64 ounces of water a day. Your appetite will return with time.  Shower per your regular routine daily.  Do not take hot showers. Take warm showers that are less than 10 minutes. Rest and listen to your body, but do not remain in bed all day.  Walk everyday for at least 15-20 minutes. Deep cough and move around every 1-2 hours in the first few days after surgery.  Do not lift > 10 lbs, perform excessive bending, pushing, pulling, squatting for 1-2 weeks after surgery.  Do not pick at the dermabond glue on your incision sites.  This glue film will remain in place for 1-2 weeks and will start to peel off.  Do not place lotions or balms on your incision unless instructed to specifically by Dr. Bridges.   Pain Expectations and Narcotics: -After surgery you will have pain associated with your incisions and this is normal. The pain is muscular and nerve pain, and will get better with time. -You are encouraged and expected to take non narcotic medications like tylenol and ibuprofen (when able) to treat pain as multiple modalities can aid with pain treatment. -Narcotics are only used when pain is severe or there is breakthrough pain. -You are not expected to have a pain score of 0 after surgery, as we cannot prevent pain. A pain score of 3-4 that allows you to be functional, move, walk, and tolerate some activity is  the goal. The pain will continue to improve over the days after surgery and is dependent on your surgery. -Due to [place] law, we are only able to give a certain amount of pain medication to treat post operative pain, and we only give additional narcotics on a patient by patient basis.  -For most laparoscopic surgery, studies have shown that the majority of patients only need 10-15 narcotic pills, and for open surgeries most patients only need 15-20.   -Having appropriate expectations of pain and knowledge of pain management with non narcotics is important as we do not want anyone to become addicted to narcotic pain medication.  -Using ice packs in the first 48 hours and heating pads after 48 hours, wearing an abdominal binder (when recommended), and using over the counter medications are all ways to help with pain management.   -Simple acts like meditation and mindfulness practices after surgery can also help with pain control and research has proven the benefit of these practices.  Medication: Take tylenol and ibuprofen as needed for pain control, alternating every 4-6 hours.  Example:  Tylenol 1000mg @ 6am, 12noon, 6pm, 12midnight (Do not exceed 4000mg of tylenol a day). Ibuprofen 800mg @ 9am, 3pm, 9pm, 3am (Do not exceed 3600mg of ibuprofen a day).  Take Roxicodone for breakthrough pain every 4 hours.  Take Colace for constipation related to narcotic pain medication. If you do not have a bowel movement in 2 days, take Miralax   over the counter.  Drink plenty of water to also prevent constipation.   Contact Information: If you have questions or concerns, please call our office, [phone], Monday- Thursday 8AM-5PM and Friday 8AM-12Noon.  If it is after hours or on the weekend, please call [facility], [phone], and ask to speak to the surgeon on call for Dr. Constance Haw at [facility].   Instrucciones de alta para la ciruga laparoscpica:  Kandis Fantasia Comunes: El dolor en el hombro derecho es  comn despus de la ciruga laparoscpica. Esto es secundario a que el gas utilizado en la ciruga queda atrapado debajo del diafragma. Camine para ayudar a su cuerpo a absorber el gas. Esto mejorar en [employer]. El dolor en los sitios portuarios es comn, especialmente en los sitios portuarios ms grandes. Esto mejorar con [employer]. Algunas nuseas son comunes y falta de apetito. El objetivo principal es mantenerse hidratado los primeros das despus de la [place].  Dieta / Actividad: Dieta segn la tolerancia. Es posible que no tenga apetito, pero es importante mantenerse hidratado. Beba 64 onzas de agua al [employer]. Tu apetito volver con Physiological scientist. Dchese segn su rutina habitual todos los [place]. No tome duchas calientes. Tome duchas tibias que duren menos de 10 minutos. Descanse y escuche a su cuerpo, pero no permanezca en la cama todo Games developer. Camine [employer] al menos 15 a 20 minutos. Toser profundamente y [employer] cada 1-2 horas durante los primeros das despus de la [place]. No levante ms de 10 libras, realice flexiones, empujes, tirones, sentadillas en cuclillas durante 1-2 semanas despus de la ciruga. No toque el pegamento dermabond en los sitios de la incisin. Esta pelcula de pegamento permanecer en su lugar durante 1-2 semanas y comenzar a desprenderse. No coloque lociones o blsamos en su incisin a menos que se lo indique especficamente el Dr. Constance Haw.  Expectativas de Social research officer, government y narcticos: -Despus de la ciruga, tendr dolor asociado con sus incisiones y esto es normal. El dolor es muscular y [place], y mejorar con Physiological scientist. -Se le recomienda y se espera que tome medicamentos no narcticos como tylenol e ibuprofeno (cuando sea posible) para tratar Conservation officer, historic buildings, ya que mltiples modalidades pueden ayudar con el tratamiento del Social research officer, government. -Los narcticos solo se usan cuando el dolor es severo o hay dolor irruptivo. -No se espera que tenga una puntuacin de Social research officer, government de 0  despus de la ciruga, ya que no podemos prevenir Conservation officer, historic buildings. El objetivo es una puntuacin de Social research officer, government de 3-4 que le permita ser funcional, [place], caminar y Tax inspector. El Engineer, structural mejorando [employer] posteriores a la [place] y depende de su [place]. -Debido a la ley de [place], solo podemos administrar una cierta cantidad de analgsicos para tratar Conservation officer, historic buildings posoperatorio, y solo administramos narcticos adicionales paciente por paciente. -Para la mayora de las cirugas laparoscpicas, los estudios han demostrado que la mayora de los pacientes solo necesitan de 10 a 15 pastillas narcticas, y para las cirugas abiertas, la mayora de los pacientes solo necesitan de 15 a 86 pastillas. -Tener expectativas adecuadas sobre el dolor y conocimiento del manejo del Social research officer, government con no narcticos es importante ya que no queremos que nadie se vuelva adicto a los analgsicos narcticos. -El uso de bolsas de hielo en las primeras 60 horas y almohadillas trmicas despus de las 79 horas, el uso de una faja abdominal (cuando se recomiende) y el uso de medicamentos de venta libre son formas de ayudar con el  manejo del dolor. -Actos simples como la meditacin y las prcticas de atencin plena despus de la ciruga tambin pueden ayudar con el control del dolor y la investigacin ha demostrado el beneficio de Scientist, physiological.  Medicamento: Tome tylenol e ibuprofeno segn sea necesario para controlar el dolor, alternando cada 4-6 horas. Ejemplo: Tylenol 1000 mg a las 6 a. M., A las 12 del medioda, a las 6 p. M., A las 12 de la medianoche (No exceda los 4000 mg de tylenol al da). Ibuprofeno 800 mg a las 9 a. M., 3 p. M., 9 p. M., 3 a. M. (No exceda los 3600 mg de ibuprofeno al da). Tome Roxicodone para el dolor irruptivo cada 4 horas. Tome Colace para el estreimiento relacionado con analgsicos narcticos. Si no tiene una evacuacin intestinal en 2 das, tome Miralax sin receta. Birdie Hopes agua para prevenir tambin el estreimiento.  Informacin del contacto: Si tiene preguntas o inquietudes, llame a Rolm Gala, [phone], de lunes a jueves de 8 a. M. A 5 p. M. Y los viernes de 8 a. M. A 12 del medioda. Si es fuera del horario de atencin o durante el fin de [place], llame al nmero principal de [place], [phone], y pida hablar con el cirujano de guardia del Dr. Henreitta Leber en [facility].   Laparoscopic Cholecystectomy, Care After This sheet gives you information about how to care for yourself after your procedure. Your doctor may also give you more specific instructions. If you have problems or questions, contact your doctor. Follow these instructions at home: Care for cuts from surgery (incisions)   Follow instructions from your doctor about how to take care of your cuts from surgery. Make sure you: ? Wash your hands with soap and water before you change your bandage (dressing). If you cannot use soap and water, use hand sanitizer. ? Change your bandage as told by your doctor. ? Leave stitches (sutures), skin glue, or skin tape (adhesive) strips in place. They may need to stay in place for 2 weeks or longer. If tape strips get loose and curl up, you may trim the loose edges. Do not remove tape strips completely unless your doctor says it is okay.  Do not take baths, swim, or use a hot tub until your doctor says it is okay.   You may shower.  Check your surgical cut area every day for signs of infection. Check for: ? More redness, swelling, or pain. ? More fluid or blood. ? Warmth. ? Pus or a bad smell. Activity  Do not drive or use heavy machinery while taking prescription pain medicine.  Do not lift anything that is heavier than 10 lb (4.5 kg) until your doctor says it is okay.  Do not play contact sports until your doctor says it is okay.  Do not drive for 24 hours if you were given a medicine to help you relax (sedative).  Rest as needed. Do not  return to work or school until your doctor says it is okay. General instructions  Take over-the-counter and prescription medicines only as told by your doctor.  To prevent or treat constipation while you are taking prescription pain medicine, your doctor may recommend that you: ? Drink enough fluid to keep your pee (urine) clear or pale yellow. ? Take over-the-counter or prescription medicines. ? Eat foods that are high in fiber, such as fresh fruits and vegetables, whole grains, and beans. ? Limit foods that are high in fat and processed sugars, such  as fried and sweet foods. Contact a doctor if:  You develop a rash.  You have more redness, swelling, or pain around your surgical cuts.  You have more fluid or blood coming from your surgical cuts.  Your surgical cuts feel warm to the touch.  You have pus or a bad smell coming from your surgical cuts.  You have a fever.  One or more of your surgical cuts breaks open. Get help right away if:  You have trouble breathing.  You have chest pain.  You have pain that is getting worse in your shoulders.  You faint or feel dizzy when you stand.  You have very bad pain in your belly (abdomen).  You are sick to your stomach (nauseous) for more than one day.  You have throwing up (vomiting) that lasts for more than one day.  You have leg pain. This information is not intended to replace advice given to you by your health care provider. Make sure you discuss any questions you have with your health care provider. Document Revised: 09/15/2017 Document Reviewed: 03/21/2016 Elsevier Patient Education  2020 Elsevier Inc.   Colecistectoma laparoscpica, cuidados posteriores Laparoscopic Cholecystectomy, Care After Lea esta informacin sobre cmo cuidarse despus del procedimiento. El mdico tambin podr darle indicaciones ms especficas. Si tiene problemas o preguntas, llame al mdico. Siga estas indicaciones en su casa: Cuidado de  los cortes de la ciruga (incisiones)   Siga las indicaciones del mdico en lo que respecta al cuidado de los cortes de la [place]. Haga lo siguiente: ? Lvese las manos con agua y jabn antes de Multimedia programmercambiar las vendas (vendaje). Use un desinfectante para manos si no dispone de [place] y [place]. ? Cambie el vendaje como se lo haya indicado el mdico. ? No retire los puntos (suturas), la goma para cerrar la piel o las tiras [place] Heightsadhesivas. Tal vez deban dejarse puestos en la piel durante 2semanas o ms tiempo. Si las tiras Lannonadhesivas se despegan y se enroscan, puede recortar los bordes sueltos. No retire las tiras Agilent Technologiesadhesivas por completo a menos que el mdico lo autorice.  No tome baos de inmersin, no practique natacin ni use el jacuzzi hasta que el mdico lo autorice.   Puedes ducharse.   Controle la zona alrededor del corte todos los das para detectar signos de infeccin. Est atento a los siguientes signos: ? Aumento del enrojecimiento, de la hinchazn o del dolor. ? Ms lquido [place]o sangre. ? Calor. ? Pus o mal olor. Actividad  No conduzca ni use maquinaria pesada mientras toma analgsicos recetados.  No levante ningn objeto que pese ms de 10libras (4,5kg) hasta que el mdico lo autorice.  No practique deportes de contacto hasta que el mdico lo autorice.  No conduzca durante 24horas si le dieron un medicamento para ayudarlo a que se relaje (sedante).  Descanse todo lo que sea necesario. No retome el trabajo ni el estudio hasta que el mdico lo autorice. Instrucciones generales  Baxter Internationalome los medicamentos de venta libre y los recetados solamente como se lo haya indicado el mdico.  A fin de prevenir o tratar el estreimiento mientras toma analgsicos recetados, el mdico puede recomendarle lo siguiente: ? Product managerBeber suficiente lquido para Pharmacologistmantener el pis (orina) claro o de color amarillo plido. ? Tomar medicamentos recetados o de H. J. Heinzventa libre. ? Consumir alimentos ricos en fibra, como frutas y  verduras frescas, cereales integrales y frijoles. ? Limitar el consumo de alimentos con alto contenido de grasas y azcares procesados, como alimentos fritos o  dulces. Comunquese con un mdico si:  Le aparece una erupcin cutnea.  Tiene ms enrojecimiento, hinchazn o dolor alrededor [employer] Corporation cortes de la [place].  Aumenta la cantidad de lquido o sangre que sale de los cortes.  Los cortes de la ciruga se sienten calientes al tacto.  Tiene pus o percibe mal olor que [employer] cortes.  Tiene fiebre.  Se abren uno o ms de los cortes. Solicite ayuda de inmediato si:  Tiene dificultad para respirar.  Siente dolor en el pecho.  Siente dolor que empeora en la zona de los hombros.  Se desmaya o se siente mareado al ponerse de pie.  Tiene dolor muy intenso de vientre (abdomen).  Tiene malestar estomacal (nuseas) que dura ms de Civil engineer, contracting.  Vomita durante ms de Civil engineer, contracting.  Siente dolor en la pierna. Esta informacin no tiene Theme park manager el consejo del mdico. Asegrese de hacerle al mdico cualquier pregunta que tenga. Document Revised: 01/09/2017 Document Reviewed: 03/21/2016 Elsevier Patient Education  2020 Elsevier Inc.  PATIENT INSTRUCTIONS POST-ANESTHESIA  IMMEDIATELY FOLLOWING SURGERY:  Do not drive or operate machinery for the first twenty four hours after surgery.  Do not make any important decisions for twenty four hours after surgery or while taking narcotic pain medications or sedatives.  If you develop intractable nausea and vomiting or a severe headache please notify your doctor immediately.  FOLLOW-UP:  Please make an appointment with your surgeon as instructed. You do not need to follow up with anesthesia unless specifically instructed to do so.  WOUND CARE INSTRUCTIONS (if applicable):  Keep a dry clean dressing on the anesthesia/puncture wound site if there is drainage.  Once the wound has quit draining you may leave it open to air.  Generally you should  leave the bandage intact for twenty four hours unless there is drainage.  If the epidural site drains for more than 36-48 hours please call the anesthesia department.  QUESTIONS?:  Please feel free to call your physician or the hospital operator if you have any questions, and they will be happy to assist you.       Anestesia general, en adultos General Anesthesia, Adult La anestesia general es el uso de medicamentos para dormir a Physiological scientist persona (dejarla inconsciente) para un procedimiento mdico. La anestesia general se debe usar para ciertos procedimientos y normalmente se recomienda para procedimientos que:  Teaching laboratory technician.  Requieren que se quede quieto o que est en una posicin inusual.  Son importantes y pueden producir prdida de Retail buyer. Los medicamentos utilizados para la anestesia general se llaman anestsicos generales. Adems de dejarlo inconsciente durante una cierta cantidad de [place], estos medicamentos:  Geneticist, molecular.  Controlan su presin arterial.  Relajan los msculos. Informe al mdico acerca de lo siguiente:  Cualquier alergia que tenga.  Todos los Walt Disney, incluidos vitaminas, hierbas, gotas oftlmicas, cremas y [address] de [address].  Cualquier problema previo que usted o algn miembro de su familia haya tenido con los anestsicos.  Tipos de anestsicos que le hayan administrado en el pasado.  Cualquier trastorno de la sangre que tenga.  Cirugas a las que se haya sometido.  Cualquier afeccin mdica que tenga.  Infecciones recientes en las vas respiratorias altas, el pecho o el odo.  Antecedentes de: ? Afecciones cardacas o pulmonares, como insuficiencia cardaca, apnea del sueo, asma o enfermedad pulmonar obstructiva crnica (EPOC). ? Paramedic. ? Depresin o ansiedad.  Cualquier consumo de tabaco o drogas ilegales, lo que  incluye consumo de marihuana o alcohol.  Si est embarazada o podra estarlo. Cules  son los riesgos? En general, se trata de un procedimiento seguro. Sin embargo, pueden ocurrir complicaciones, por ejemplo:  Automotive engineer.  Problemas cardacos o pulmonares.  Inhalacin de alimentos o lquidos del estmago a los pulmones (aspiracin).  Lesiones en los nervios.  Lesin ONEOK.  Aire en el torrente sanguneo, que puede provocar un accidente cerebrovascular.  Nerviosismo o confusin extremos (delirio) al despertarse de la anestesia.  Despertarse durante su procedimiento y no poder moverse. Esto es poco frecuente. Es ms probable que Set designer en caso de que le realicen una ciruga mayor o si tiene una afeccin mdica avanzada o grave. Puede evitar algunas de estas complicaciones respondiendo todas las preguntas del mdico meticulosamente y siguiendo todas las indicaciones que le brinden antes del procedimiento. La anestesia general puede causar efectos secundarios, como por ejemplo:  Nuseas o vmitos.  Dolor de garganta causado por el tubo respiratorio.  Ronquera.  Tos o sibilancias.  Escalofros.  Cansancio.  Dolores [employer]epsi[employer] cuerpo.  Ansiedad.  Sueo o somnolencia.  Confusin o agitacin. Qu ocurre antes del procedimiento? Cmo mantenerse hidratado Siga las indicaciones del mdico acerca de mantenerse hidratado, las cuales pueden incluir lo siguiente:  [employer] horas antes del procedimiento, puede beber lquidos claros, como agua, jugos de fruta sin pulpa, caf negro y t solo.  Restricciones en las comidas y bebidas Siga las instrucciones del mdico respecto de las restricciones de comidas o bebidas, las cuales pueden incluir lo siguiente:  Ocho horas antes del procedimiento, deje de ingerir comidas o alimentos pesados, por ejemplo, carne, alimentos fritos o alimentos grasos.  Seis horas antes del procedimiento, deje de ingerir comidas o alimentos livianos, como tostadas o cereales.  Seis horas antes del procedimiento,  deje de tomar [address] o bebidas que [employer]oco[employer]ill[employer].  Dos horas antes del procedimiento, deje de beber lquidos claros. Medicamentos Consulte al mdico sobre:  Multimedia programmer o suspender los medicamentos que toma habitualmente. Esto es muy importante si toma medicamentos para la diabetes o anticoagulantes.  Tomar medicamentos como aspirina e ibuprofeno. Estos medicamentos pueden tener un efecto anticoagulante en la [place]. No tome estos medicamentos a menos que el mdico se lo indique.  Tomar medicamentos de H. J. Heinz, vitaminas, hierbas y suplementos. No tome estos medicamentos durante la semana anterior a la ciruga, a menos que el mdico lo autorice. Indicaciones generales  A partir de 3 a 6 semanas antes del procedimiento, no consuma ningn producto que contenga tabaco o nicotina, como cigarrillos y Administrator, Civil Service. Si necesita ayuda para dejar de fumar, consulte al mdico.  Si se lava los dientes la maana del procedimiento, asegrese de escupir toda la pasta de dientes.  Informe al mdico si se enferma o si se resfra, tiene tos o fiebre.  Si se lo indica el mdico, lleve el dispositivo para la apnea del sueo con usted el da de la ciruga (si corresponde).  Pregntele al mdico si volver a su casa el mismo da o al da siguiente, o si debe quedarse en el hospital por [employer]V. ? Haga que alguien lo lleve a su casa desde el hospital o la clnica. ? Haga que un adulto responsable lo cuide durante al menos 24horas despus de que le den el alta del hospital o de la [place]. Esto es importante. Qu ocurre durante el procedimiento?   Se le administrar la anestesia a travs lo siguiente: ? [place] colocada sobre  su nariz y boca. ? Una va intravenosa en una de las venas.  Pueden administrarle un medicamento para que se relaje (sedante).  Una vez que est inconsciente, se le insertar un tubo respiratorio en la garganta para ayudarlo a respirar. Este se quitar antes de que  despierte.  Un anestesista permanecer con usted durante todo el procedimiento. El mdico: ? Continuar administrndole medicamentos y ajustando la dosis para mantenerlo cmodo y Dispensing optician. ? Controlar la presin arterial, el pulso y los niveles de oxgeno para asegurarse de que la anestesia no le cause ningn problema. Este procedimiento puede variar segn el mdico y el hospital. Ladell Heads ocurre despus del procedimiento?  Le controlarn la presin arterial, la temperatura, la frecuencia cardaca, la frecuencia respiratoria y Air cabin crew de oxgeno en la sangre hasta que desaparezca el efecto de los medicamentos administrados.  Se despertar en un rea de recuperacin. Puede que se despierte lentamente.  Si est agitado o ansioso, pueden darle medicamentos que lo ayuden a Animator.  Si volver a Sales promotion account executive, el mdico verificar que pueda caminar, beber y Geographical information systems officer.  El mdico tambin tratar Psychologist, sport and exercise o efecto secundario que tenga antes de que se vaya a su casa.  No conduzca durante 24horas si le administraron un sedante. Resumen  La anestesia general se [place] para mantenerlo quieto y Psychologist, sport and exercise durante un procedimiento.  Es importante que le informe al mdico acerca de sus antecedentes mdicos y cualquier ciruga que haya tenido, as como su experiencia previa con la anestesia.  Siga las instrucciones del mdico acerca de cundo dejar de comer, beber o tomar ciertos medicamentos antes del procedimiento.  Haga que alguien lo lleve a su casa desde el hospital o la clnica. Esta informacin no tiene Theme park manager el consejo del mdico. Asegrese de hacerle al mdico cualquier pregunta que tenga. Document Revised: 03/28/2018 Document Reviewed: 03/28/2018 Elsevier Patient Education  2020 [employer].

## 2020-08-19 NOTE — Anesthesia Postprocedure Evaluation (Signed)
Anesthesia Post Note  Patient: Carol Huff  Procedure(s) Performed: LAPAROSCOPIC CHOLECYSTECTOMY (N/A )  Patient location during evaluation: PACU Anesthesia Type: General Level of consciousness: awake, oriented, awake and alert and patient cooperative Pain management: pain level controlled Vital Signs Assessment: post-procedure vital signs reviewed and stable Respiratory status: spontaneous breathing, respiratory function stable and nonlabored ventilation Cardiovascular status: blood pressure returned to baseline and stable Postop Assessment: no headache and no backache Anesthetic complications: no   No complications documented.   Last Vitals:  Vitals:   08/19/20 0655  BP: 111/67  Resp: 18  Temp: 36.8 C  SpO2: 97%    Last Pain:  Vitals:   08/19/20 0655  TempSrc: Oral  PainSc: 0-No pain                 Brynda Peon

## 2020-08-20 ENCOUNTER — Encounter (HOSPITAL_COMMUNITY): Payer: Self-pay | Admitting: General Surgery

## 2020-08-20 LAB — SURGICAL PATHOLOGY

## 2020-08-24 NOTE — Progress Notes (Signed)
Patient referred to [place] for right upper quadrant abdominal pain with a an ultrasound positive for cholelithiasis with cholecystitis.  I believe this referral was meant to be made to general surgery.  Discussed case with general surgery who agrees.  Dr. Henreitta Leber has graciously agreed to see patient later today in her office.  No charge for GI visit today.

## 2020-09-24 ENCOUNTER — Ambulatory Visit: Payer: No Typology Code available for payment source | Attending: Internal Medicine

## 2020-09-24 DIAGNOSIS — Z23 Encounter for immunization: Secondary | ICD-10-CM

## 2020-09-24 NOTE — Progress Notes (Signed)
   Covid-19 Vaccination Clinic  Name:  Carol Huff    MRN: [id number] DOB: [birth year]  09/24/2020  Carol Huff was observed post Covid-19 immunization for 15 minutes without incident. She was provided with Vaccine Information Sheet and instruction to access the V-Safe system.   Carol Huff was instructed to call 911 with any severe reactions post vaccine: Marland Kitchen Difficulty breathing  . Swelling of face and throat  . A fast heartbeat  . A bad rash all over body  . Dizziness and weakness   Immunizations Administered    No immunizations on file.

## 2021-04-08 ENCOUNTER — Ambulatory Visit: Payer: Self-pay | Admitting: Physician Assistant

## 2021-04-08 ENCOUNTER — Other Ambulatory Visit: Payer: Self-pay

## 2021-04-08 ENCOUNTER — Encounter: Payer: Self-pay | Admitting: Physician Assistant

## 2021-04-08 VITALS — BP 109/73 | HR 74 | Temp 97.6°F | Wt 178.0 lb

## 2021-04-08 DIAGNOSIS — Z789 Other specified health status: Secondary | ICD-10-CM

## 2021-04-08 DIAGNOSIS — G5601 Carpal tunnel syndrome, right upper limb: Secondary | ICD-10-CM

## 2021-04-08 MED ORDER — NAPROXEN 500 MG PO TABS: 500.0000 mg | ORAL_TABLET | Freq: Two times a day (BID) | ORAL | 0 refills | Status: DC

## 2021-04-08 NOTE — Progress Notes (Signed)
   BP 109/73   Pulse 74   Temp 97.6 F (36.4 C)   Wt 178 lb (80.7 kg)   BMI 35.05 kg/m    Subjective:    Patient ID: Carol Huff, female    DOB: 09-May-1976, 45 y.o.   MRN: [id number]  HPI: LUPIE SAWA is a 45 y.o. female presenting on 04/08/2021 for Arm Pain   HPI  Pt had a negative covid 19 screening questionnaire.   Pt c/o R hand/wrist pain 2-3 months along the thumb side.  It only hurts when she uses it.  She does not work.  She did not injury herself.  The pain has been worsening.  She denies use of computer or cell phone game playing.      Relevant past medical, surgical, family and social history reviewed and updated as indicated. Interim medical history since our last visit reviewed. Allergies and medications reviewed and updated.  Review of Systems  Per HPI unless specifically indicated above     Objective:    BP 109/73   Pulse 74   Temp 97.6 F (36.4 C)   Wt 178 lb (80.7 kg)   BMI 35.05 kg/m   Wt Readings from Last 3 Encounters:  04/08/21 178 lb (80.7 kg)  08/17/20 173 lb 15.8 oz (78.9 kg)  08/13/20 174 lb (78.9 kg)    Physical Exam Vitals reviewed.  Constitutional:      General: She is not in acute distress.    Appearance: She is not ill-appearing.  HENT:     Head: Normocephalic and atraumatic.  Pulmonary:     Effort: Pulmonary effort is normal. No respiratory distress.  Musculoskeletal:     Right forearm: Normal.     Right wrist: Tenderness present. No swelling, bony tenderness or snuff box tenderness. Decreased range of motion. Normal pulse.     Right hand: Normal.     Comments: Negative Tinel.  Positive phalen. Pain increase with ROM / flexion & extension of R wrist  Neurological:     Mental Status: She is alert and oriented to person, place, and time.  Psychiatric:        Attention and Perception: Attention normal.        Speech: Speech normal.        Behavior: Behavior normal. Behavior is cooperative.            Assessment & Plan:    Encounter Diagnoses  Name Primary?   Carpal tunnel syndrome of right wrist Yes   Not proficient in English language      -Wrist splint -Ice 10-20 min 4/day -Naproxen -pt was given reading information on carpal tunnel -pt has routine follow up July 12.  Will recheck at that time.  Pt is to call sooner prn worsening or new symptoms

## 2021-04-14 ENCOUNTER — Other Ambulatory Visit: Payer: Self-pay | Admitting: Physician Assistant

## 2021-04-23 ENCOUNTER — Other Ambulatory Visit: Payer: Self-pay | Admitting: Physician Assistant

## 2021-04-27 ENCOUNTER — Other Ambulatory Visit: Payer: Self-pay

## 2021-04-27 ENCOUNTER — Ambulatory Visit: Payer: Self-pay | Admitting: Physician Assistant

## 2021-04-27 ENCOUNTER — Encounter: Payer: Self-pay | Admitting: Physician Assistant

## 2021-04-27 VITALS — BP 109/59 | HR 75 | Temp 98.2°F | Wt 179.0 lb

## 2021-04-27 DIAGNOSIS — Z789 Other specified health status: Secondary | ICD-10-CM

## 2021-04-27 DIAGNOSIS — Z1239 Encounter for other screening for malignant neoplasm of breast: Secondary | ICD-10-CM

## 2021-04-27 DIAGNOSIS — Z0000 Encounter for general adult medical examination without abnormal findings: Secondary | ICD-10-CM

## 2021-04-27 DIAGNOSIS — E669 Obesity, unspecified: Secondary | ICD-10-CM

## 2021-04-27 DIAGNOSIS — G5601 Carpal tunnel syndrome, right upper limb: Secondary | ICD-10-CM

## 2021-04-27 NOTE — Progress Notes (Signed)
BP (!) 109/59   Pulse 75   Temp 98.2 F (36.8 C)   Wt 179 lb (81.2 kg)   SpO2 98%   BMI 35.25 kg/m    Subjective:    Patient ID: Carol Huff, female    DOB: 1976/07/14, 45 y.o.   MRN: [id number]  HPI: AMYLA Huff is a 45 y.o. female presenting on 04/27/2021 for No chief complaint on file.   HPI   Pt had a negative covid 19 screening questionnaire.   Pt says her Hand is better.  She was seen last month for carpal tunnel.  She says she Exercises regularly  She says she sometimes has pain where she had her GB surgery.  She had that done in November.   She had no post-op appointment.  She says the pain started about 3 months.  She says pain isn't severe.  Pt says pain is on the inside but then wonders if the pain is from her bra.  She has noticed no relation between the pain and meals.   Pain is rated as a  4  on a scale to 10.  She says sometimes pain gets better if she taes off her bra.     Relevant past medical, surgical, family and social history reviewed and updated as indicated. Interim medical history since our last visit reviewed. Allergies and medications reviewed and updated.  CURRENT MEDS: acyclovir  Review of Systems  Per HPI unless specifically indicated above     Objective:    BP (!) 109/59   Pulse 75   Temp 98.2 F (36.8 C)   Wt 179 lb (81.2 kg)   SpO2 98%   BMI 35.25 kg/m   Wt Readings from Last 3 Encounters:  04/27/21 179 lb (81.2 kg)  04/08/21 178 lb (80.7 kg)  08/17/20 173 lb 15.8 oz (78.9 kg)    Physical Exam Vitals reviewed.  Constitutional:      General: She is not in acute distress.    Appearance: She is well-developed. She is not ill-appearing.  HENT:     Head: Normocephalic and atraumatic.     Right Ear: Tympanic membrane, ear canal and external ear normal.     Left Ear: Tympanic membrane, ear canal and external ear normal.  Eyes:     Extraocular Movements: Extraocular movements intact.      Conjunctiva/sclera: Conjunctivae normal.     Pupils: Pupils are equal, round, and reactive to light.  Cardiovascular:     Rate and Rhythm: Normal rate and regular rhythm.  Pulmonary:     Effort: Pulmonary effort is normal.     Breath sounds: Normal breath sounds.  Chest:     Chest wall: No mass, swelling or tenderness.     Comments: There is no tenderness in the area she reports pain.  The surgical scars appear to be healing normally. Abdominal:     General: Bowel sounds are normal.     Palpations: Abdomen is soft. There is no hepatomegaly, splenomegaly, mass or pulsatile mass.     Tenderness: There is no abdominal tenderness.  Musculoskeletal:     Cervical back: Neck supple.     Right lower leg: No edema.     Left lower leg: No edema.  Lymphadenopathy:     Cervical: No cervical adenopathy.  Skin:    General: Skin is warm and dry.  Neurological:     Mental Status: She is alert and oriented to person, place, and time.  Motor: No weakness or tremor.     Gait: Gait is intact. Gait normal.     Deep Tendon Reflexes:     Reflex Scores:      Patellar reflexes are 2+ on the right side and 2+ on the left side. Psychiatric:        Attention and Perception: Attention normal.        Speech: Speech normal.        Behavior: Behavior normal. Behavior is cooperative.          Assessment & Plan:    Encounter Diagnoses  Name Primary?   Visit for annual health examination Yes   Not proficient in English language    Carpal tunnel syndrome of right wrist    Obesity, unspecified classification, unspecified obesity type, unspecified whether serious comorbidity present    Encounter for screening for malignant neoplasm of breast, unspecified screening modality      -refer for screening mammogram -no labs needed at this time -pt to continue to care for her carpal tunnel -discussed that sensation at her GB surgical site should continue to improve.  She she contact office for worsening  or new symptoms -encouraged healthy diet and regular exercise to maintain or get closer to healthy weight -pt to follow up 1 year.  She is to contact office sooner prn

## 2021-05-05 ENCOUNTER — Other Ambulatory Visit: Payer: Self-pay | Admitting: Physician Assistant

## 2021-05-05 DIAGNOSIS — Z1239 Encounter for other screening for malignant neoplasm of breast: Secondary | ICD-10-CM

## 2021-05-19 ENCOUNTER — Other Ambulatory Visit: Payer: Self-pay

## 2021-05-19 ENCOUNTER — Ambulatory Visit (HOSPITAL_COMMUNITY)
Admission: RE | Admit: 2021-05-19 | Discharge: 2021-05-19 | Disposition: A | Discharge status: Home / Self Care | Payer: Self-pay | Source: Ambulatory Visit | Attending: Physician Assistant | Admitting: Physician Assistant

## 2021-05-19 DIAGNOSIS — Z1239 Encounter for other screening for malignant neoplasm of breast: Secondary | ICD-10-CM | POA: Insufficient documentation

## 2021-05-26 ENCOUNTER — Other Ambulatory Visit: Payer: Self-pay | Admitting: Physician Assistant

## 2021-10-05 NOTE — Congregational Nurse Program (Signed)
°  Dept: [phone]   Congregational Nurse Program Note  Date of Encounter: 10/05/2021  Past Medical History: Past Medical History:  Diagnosis Date   HSV infection     Encounter Details:  CNP Questionnaire - 10/05/21 1509       Questionnaire   Do you give verbal consent to treat you today? Yes    Location Patient Served  [facility]    Visit Setting Phone/Text/Email    Patient Status Unknown    Insurance Unknown    Insurance Referral N/A    Medication N/A    Medical Provider Yes    Screening Referrals N/A    Medical Referral Other    Medical Appointment Made N/A    Food N/A    Transportation N/A    Housing/Utilities N/A    Interpersonal Safety N/A    Intervention Educate    ED Visit Averted N/A    Life-Saving Intervention Made N/A            Patient was called back, spoke to patient. All questions answered regarding health equipment question.

## 2021-11-24 IMAGING — US US ABDOMEN LIMITED
1 series · 14 of 25 positions shown · non-contrast
Comparison: None.

CLINICAL DATA: Right upper quadrant pain

EXAM:
ULTRASOUND ABDOMEN LIMITED RIGHT UPPER QUADRANT

[Series 1: us abdomen limited ruq · 14 of 45 slices shown]
[im 1/45]
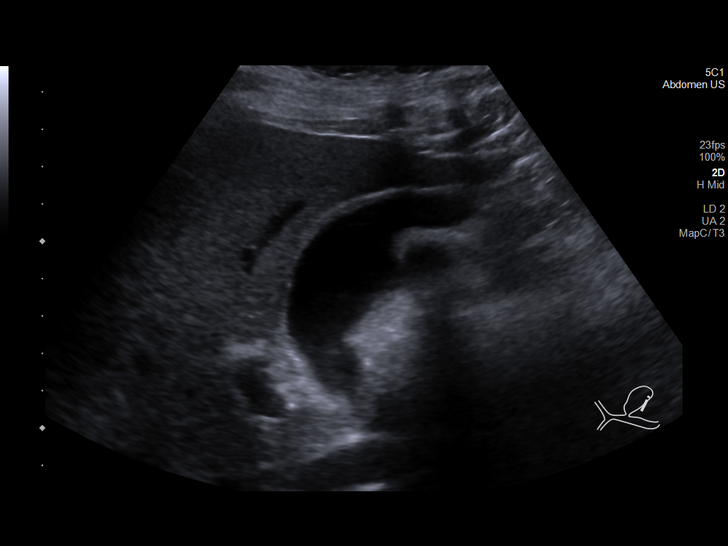
[im 4/45]
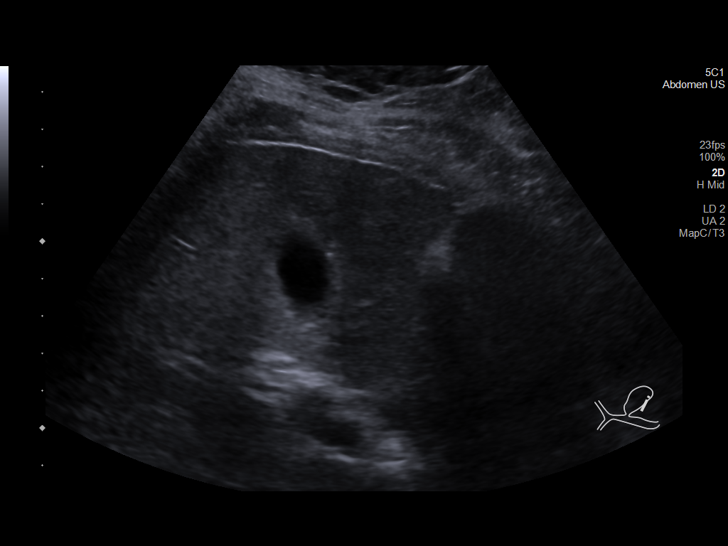
[im 8/45]
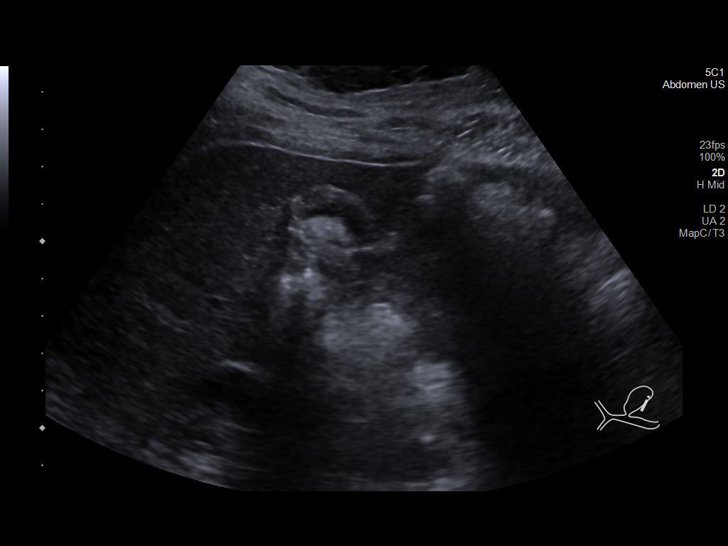
[im 12/45]
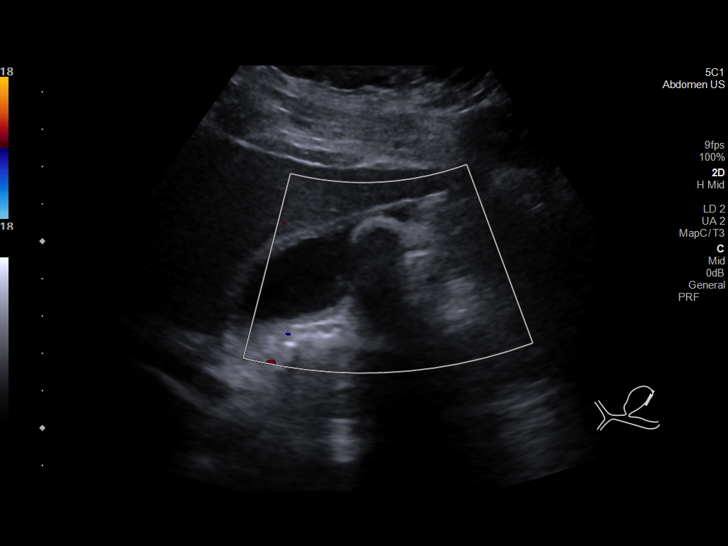
[im 15/45]
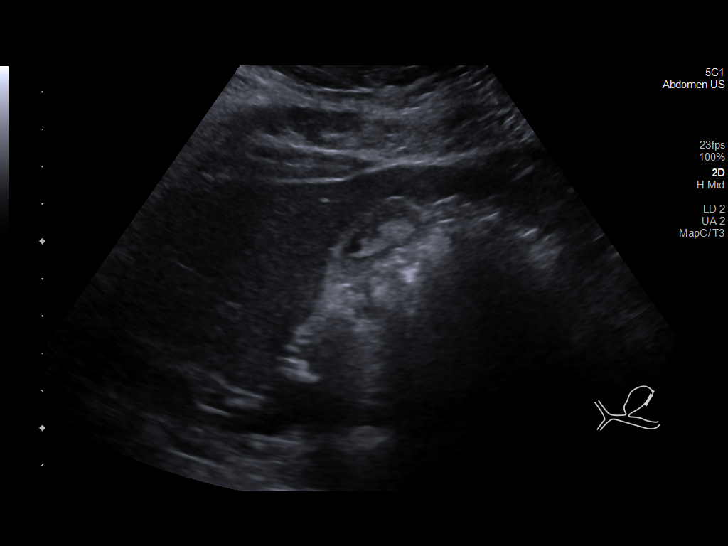
[im 17/45]
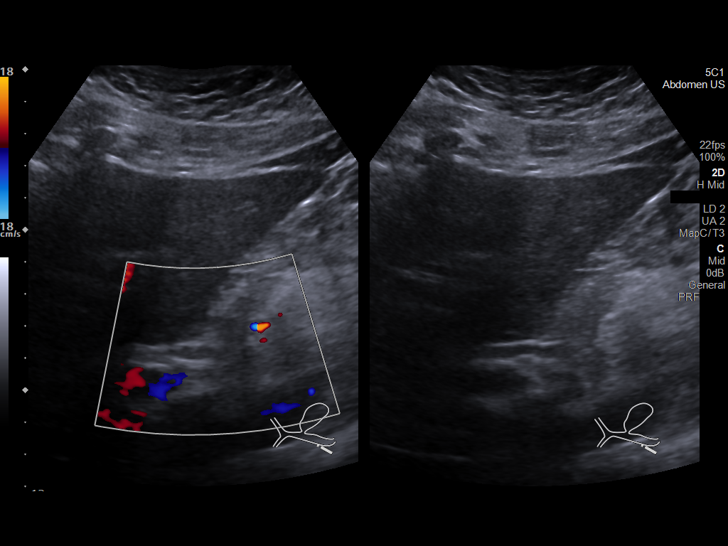
[im 21/45]
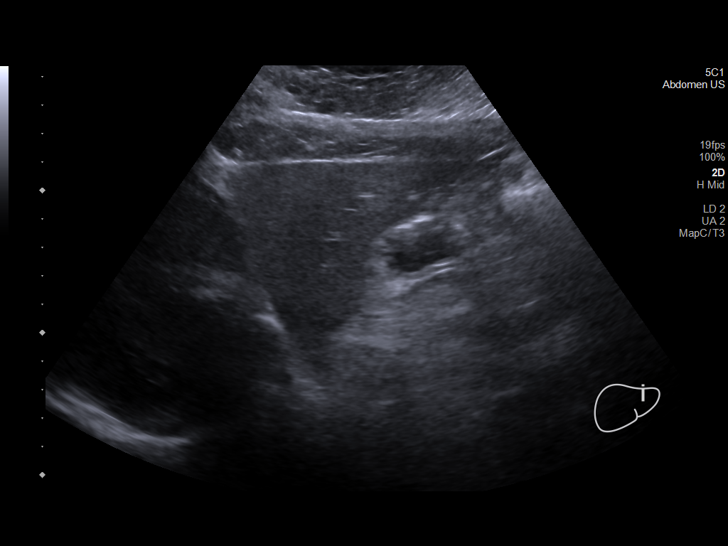
[im 24/45]
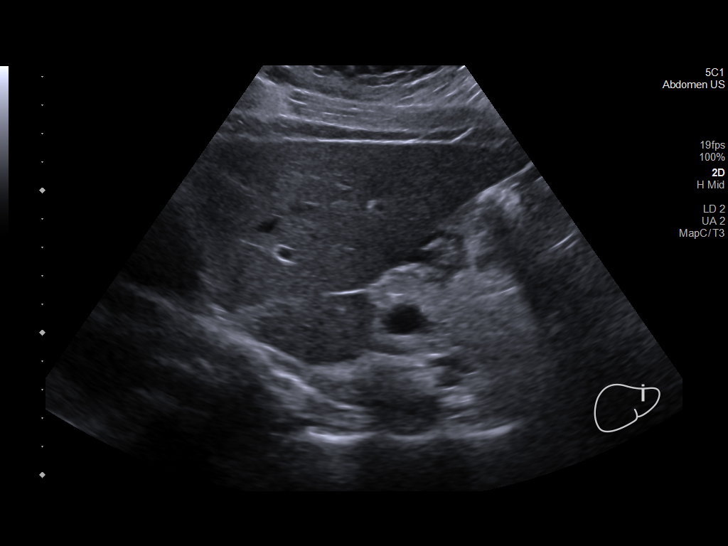
[im 28/45]
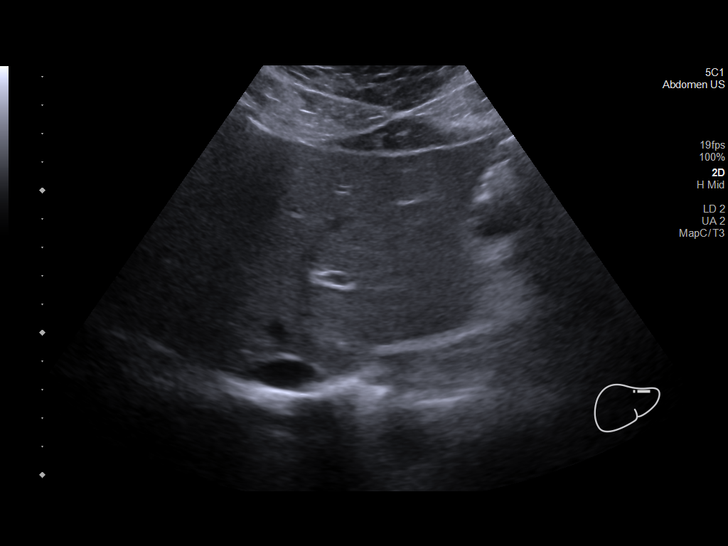
[im 30/45]
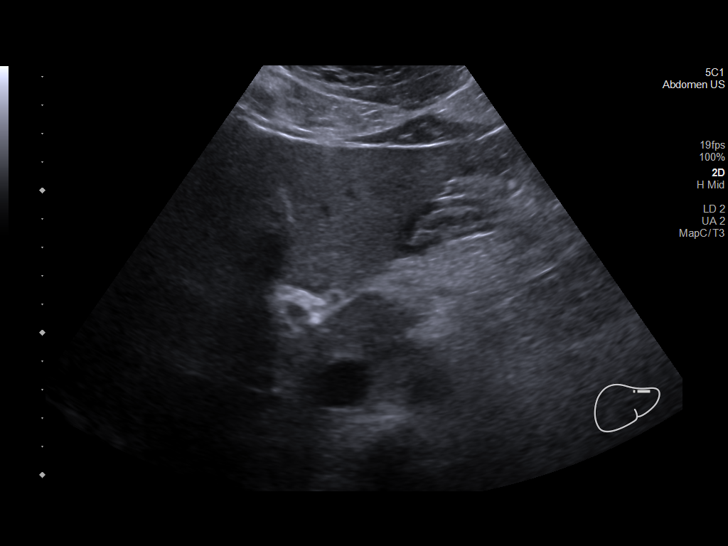
[im 34/45]
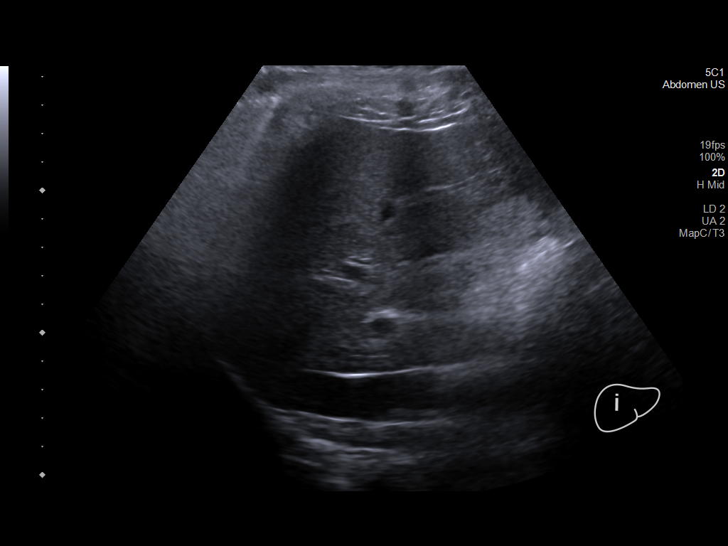
[im 37/45]
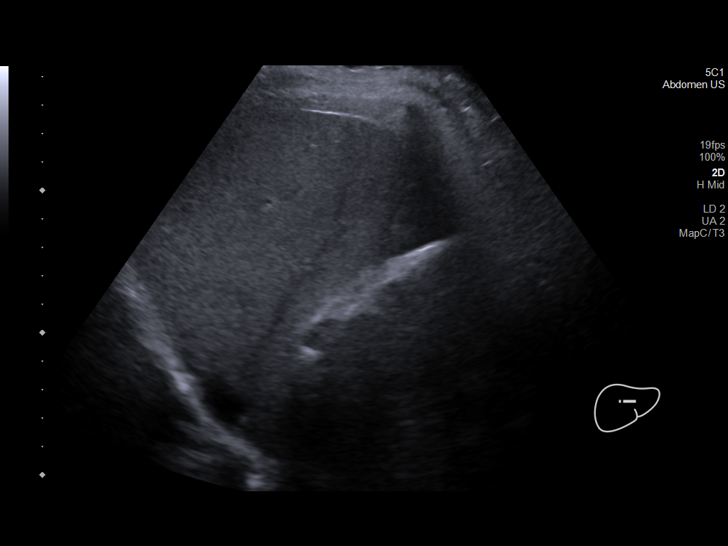
[im 41/45]
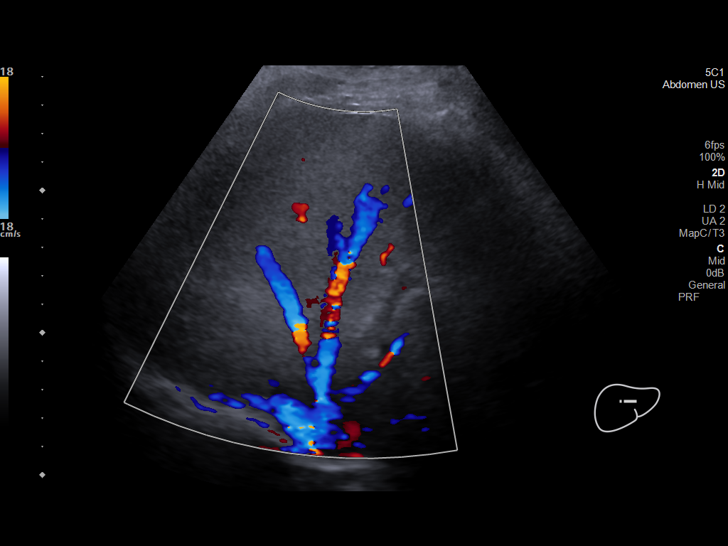
[im 45/45]
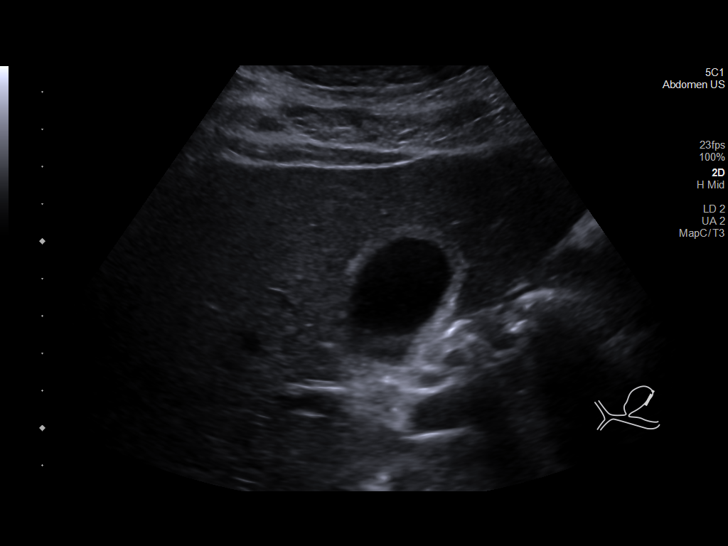

[14 of 25 positions shown; findings below may reference images not displayed]

FINDINGS: Gallbladder:

16 mm gallstone. Gallbladder wall 3 mm. Pericholecystic fluid.
Positive sonographic Murphy sign with pain over the gallbladder.

Common bile duct:

Diameter: 4 mm

Liver:

No focal lesion identified. Within normal limits in parenchymal
echogenicity. Portal vein is patent on color Doppler imaging with
normal direction of blood flow towards the liver.

Other: None.
IMPRESSION: Cholelithiasis with signs of acute cholecystitis.

These results will be called to the ordering clinician or
representative by the Radiologist Assistant, and communication
documented in the PACS or [REDACTED].

## 2021-12-17 ENCOUNTER — Other Ambulatory Visit: Payer: Self-pay | Admitting: Physician Assistant

## 2022-04-27 ENCOUNTER — Ambulatory Visit: Payer: Self-pay | Admitting: Physician Assistant

## 2022-04-27 ENCOUNTER — Encounter: Payer: Self-pay | Admitting: Physician Assistant

## 2022-04-27 VITALS — BP 106/70 | HR 81 | Temp 98.1°F | Ht 59.5 in | Wt 190.0 lb

## 2022-04-27 DIAGNOSIS — Z789 Other specified health status: Secondary | ICD-10-CM

## 2022-04-27 DIAGNOSIS — B009 Herpesviral infection, unspecified: Secondary | ICD-10-CM

## 2022-04-27 DIAGNOSIS — Z13220 Encounter for screening for lipoid disorders: Secondary | ICD-10-CM

## 2022-04-27 DIAGNOSIS — H1012 Acute atopic conjunctivitis, left eye: Secondary | ICD-10-CM

## 2022-04-27 DIAGNOSIS — Z0000 Encounter for general adult medical examination without abnormal findings: Secondary | ICD-10-CM

## 2022-04-27 DIAGNOSIS — E669 Obesity, unspecified: Secondary | ICD-10-CM

## 2022-04-27 DIAGNOSIS — I8390 Asymptomatic varicose veins of unspecified lower extremity: Secondary | ICD-10-CM

## 2022-04-27 DIAGNOSIS — Z1239 Encounter for other screening for malignant neoplasm of breast: Secondary | ICD-10-CM

## 2022-04-27 MED ORDER — ACYCLOVIR 400 MG PO TABS: 400.0000 mg | ORAL_TABLET | Freq: Two times a day (BID) | ORAL | 11 refills | Status: DC

## 2022-04-27 NOTE — Progress Notes (Unsigned)
BP 106/70   Pulse 81   Temp 98.1 F (36.7 C)   Ht 4' 11.5" (1.511 m)   Wt 190 lb (86.2 kg)   SpO2 98%   BMI 37.73 kg/m    Subjective:    Patient ID: Carol Huff, female    DOB: 11/02/1975, 46 y.o.   MRN: [id number]  HPI: Carol Huff is a 46 y.o. female presenting on 04/27/2022 for Annual Exam   HPI  Chief Complaint  Patient presents with   Annual Exam     Pt says her Left eye feels like its burning.  4 months.  Comes and goes.  Vision okay.  She uses reading glasses only.   No discharge.    She walks for 30 minutes 3 days/week.  She asks about her spider veins, if they have problems in future or what she can do to make them go away.    Relevant past medical, surgical, family and social history reviewed and updated as indicated. Interim medical history since our last visit reviewed. Allergies and medications reviewed and updated.   Current Outpatient Medications:    acyclovir (ZOVIRAX) 400 MG tablet, Take 1 tablet by mouth twice daily, Disp: 60 tablet, Rfl: 6   Review of Systems  Per HPI unless specifically indicated above     Objective:    BP 106/70   Pulse 81   Temp 98.1 F (36.7 C)   Ht 4' 11.5" (1.511 m)   Wt 190 lb (86.2 kg)   SpO2 98%   BMI 37.73 kg/m   Wt Readings from Last 3 Encounters:  04/27/22 190 lb (86.2 kg)  04/27/21 179 lb (81.2 kg)  04/08/21 178 lb (80.7 kg)     Vision Screening   Right eye Left eye Both eyes  Without correction 20/20 20/20 20/20   With correction       Physical Exam Vitals reviewed.  Constitutional:      General: She is not in acute distress.    Appearance: She is well-developed. She is obese. She is not ill-appearing.  HENT:     Head: Normocephalic and atraumatic.     Right Ear: Tympanic membrane, ear canal and external ear normal.     Left Ear: Tympanic membrane, ear canal and external ear normal.  Eyes:     General: Lids are normal. Lids are everted, no foreign bodies  appreciated.     Extraocular Movements: Extraocular movements intact.     Conjunctiva/sclera: Conjunctivae normal.     Pupils: Pupils are equal, round, and reactive to light.     Left eye: No corneal abrasion or fluorescein uptake.  Neck:     Thyroid: No thyromegaly.  Cardiovascular:     Rate and Rhythm: Normal rate and regular rhythm.  Pulmonary:     Effort: Pulmonary effort is normal.     Breath sounds: Normal breath sounds.  Abdominal:     General: Bowel sounds are normal.     Palpations: Abdomen is soft. There is no mass.     Tenderness: There is no abdominal tenderness.  Musculoskeletal:     Cervical back: Neck supple.     Right lower leg: No edema.     Left lower leg: No edema.     Comments: Spider veins LLE.   Lymphadenopathy:     Cervical: No cervical adenopathy.  Skin:    General: Skin is warm and dry.  Neurological:     Mental Status: She is alert and oriented to  person, place, and time.     Motor: No weakness or tremor.     Coordination: Coordination normal.     Gait: Gait normal.     Deep Tendon Reflexes:     Reflex Scores:      Patellar reflexes are 2+ on the right side and 2+ on the left side. Psychiatric:        Attention and Perception: Attention normal.        Mood and Affect: Mood normal.        Speech: Speech normal.        Behavior: Behavior normal. Behavior is cooperative.           Assessment & Plan:   Encounter Diagnoses  Name Primary?   Annual physical exam Yes   Screening cholesterol level    Allergic conjunctivitis of left eye    Encounter for screening for malignant neoplasm of breast, unspecified screening modality    Spider vein of lower extremity    Obesity, unspecified classification, unspecified obesity type, unspecified whether serious comorbidity present    Not proficient in English language    HSV infection      -referred for screening Mammogram -pt was reminded that she needs to Update enrollment prior to any subsequent  appointments -recommended OTC pataday or zaditor for eyes.  She should RTO if her eye symptoms worsen or persist -acyclovir rx renewed -will Check lipids.  She will be called with results -PAP due next year -Colon cancer screening to start age 47 -Answered questions about spider veins and discussed sclerotherapy if she was interested in that.  Discussed it is cosmetic and costs unknown -Counseled healthy diet and regular exercise to maintain healthy weight and good health -pt to follow up 1 year.  She is to contact office sooner prn

## 2022-04-27 NOTE — Patient Instructions (Addendum)
Pataday or zatidor   eye drops as needed    Update enrollment through [facility]  ----------------------------------------------------------   Alimentacin saludable Healthy Eating Seguir una modalidad de alimentacin saludable puede ayudarlo a Barista y Pharmacologist un peso saludable, reducir el riesgo de tener enfermedades crnicas y vivir Neomia Dear vida larga y productiva. Es importante que siga una modalidad de alimentacin saludable con un nivel adecuado de caloras para su cuerpo. Debe cubrir sus necesidades nutricionales principalmente a travs de los alimentos, escogiendo una variedad de alimentos ricos en nutrientes. Cules son algunos consejos para seguir este plan? Lea las etiquetas de los alimentos Lea las etiquetas y elija las que digan lo siguiente: Reducido en sodio o con bajo contenido de [place]. Jugos con 100 % jugo de fruta. Alimentos con bajo contenido de grasas saturadas y alto contenido de grasas poliinsaturadas y Mining engineer. Alimentos con cereales integrales, como trigo integral, trigo partido, arroz integral y arroz salvaje. Cereales integrales fortificados con cido flico. Se recomienda a las mujeres embarazadas o que desean quedar embarazadas. Lea las etiquetas y evite: Los alimentos con una gran cantidad de Biochemist, clinical. Estos incluyen los alimentos que contienen azcar moreno, endulzante a base de maz, jarabe de maz, dextrosa, fructosa, glucosa, jarabe de maz de alta fructosa, miel, azcar invertido, lactosa, jarabe de [place], maltosa, [place], azcar sin refinar, sacarosa, trehalosa y azcar turbinado. No consuma ms que las siguientes cantidades de azcar agregada por da: 6 cucharaditas (25 g) las mujeres. 9 cucharaditas (38 g) los hombres. Los alimentos que contienen almidones y cereales refinados o procesados. Los productos de cereales refinados, como harina blanca, harina de maz desgerminada, pan blanco y arroz blanco. Al ir de compras Elija  refrigerios ricos en nutrientes, como verduras, frutas enteras y frutos secos. Evite los refrigerios con alto contenido de caloras y International aid/development worker, como las papas fritas, los refrigerios frutales y los caramelos. Use alios y productos para untar a base de aceite con los Publishing rights manager de grasas slidas como la [place], la margarina en barra o el queso crema. Limite las salsas, las mezclas y los productos "instantneos" preelaborados como el arroz saborizado, los fideos instantneos y las pastas listas para comer. Pruebe ms fuentes de protena vegetal, como tofu, tempeh, frijoles negros, edamame, lentejas, frutos secos y semillas. Explore planes de alimentacin como la dieta mediterrnea o la dieta vegetariana. Al cocinar Use aceite para Designer, multimedia de grasas slidas como [place], margarina en barra o Maceo de cerdo. En lugar de frer, trate de cocinar en el horno, en la plancha o en la parrilla, o hervir los alimentos. Retire la parte grasa de las carnes antes de cocinarlas. Cocine las verduras al vapor en agua o caldo. Planificacin de las comidas  En las comidas, imagine dividir su plato en cuartos: La mitad del plato tiene frutas y verduras. Un cuarto del plato tiene cereales integrales. Un cuarto del plato tiene protena, especialmente carnes Culp, aves, huevos, tofu, frijoles o frutos secos. Incluya lcteos descremados en su dieta diaria. Estilo de vida Elija opciones saludables en todos los mbitos, como en el hogar, el [place], la [place], los restaurantes y [place]. Prepare los alimentos de un modo seguro: Lvese las manos despus de manipular carnes crudas. Mantenga las superficies de preparacin de los alimentos limpias lavndolas regularmente con agua caliente y [place]. Mantenga las carnes crudas separadas de los alimentos que estn listos para comer como las frutas y las verduras. Cocine los frutos de mar, carnes, aves y Production manager  la  temperatura interna recomendada. Almacene los alimentos a temperaturas seguras. En general: Mantenga los alimentos fros a una temperatura de 40 F (4,4 C) o inferior. Mantenga los alimentos calientes a una temperatura de 140 F (60 C) o superior. Mantenga el congelador a una temperatura de 0 F (-17,8 C) o inferior. Los alimentos dejan de ser seguros para su consumo cuando han estado a una temperatura de entre 40 y 140 F (4,4 y 60 C) por ms de 2 horas. Qu alimentos debo consumir? Frutas Propngase comer el equivalente a 2 tazas de frutas frescas, enlatadas (en su jugo natural) o Primary school teacher. Algunos ejemplos de equivalentes a 1 taza de frutas son 1 manzana pequea, 8 fresas grandes, 1 taza de fruta enlatada,  taza de fruta desecada o 1 taza de jugo 100 %. Verduras Propngase comer el equivalente a 2 o 3 tazas de verduras frescas y Primary school teacher, incluyendo diferentes variedades y colores. Algunos ejemplos de equivalentes a 1 taza de verduras son 2 zanahorias medianas, 2 tazas de verduras de [employer] crudas, 1 taza de verduras cortadas (crudas o cocidas) o 1 papa mediana al horno. Granos Propngase comer el equivalente a 6 onzas de cereales integrales por da. Algunos ejemplos de equivalentes a 1 onza de cereales son 1 rebanada de pan, 1 taza de cereal listo para comer, 3 tazas de palomitas de maz o  taza de arroz, pasta o cereales cocidos. Carnes y otras protenas Propngase comer el equivalente a 5 o 6 onzas de Publishing copy. Algunos ejemplos de equivalentes a 1 onza de protena son 1 huevo,  taza de frutos secos o semillas o 1 cucharada (16 g) de [place] de man. Un corte de carne o pescado del tamao de un mazo de cartas equivale aproximadamente a 3 a 4 onzas. De las protenas que consume cada semana, intente que al menos 8 onzas provengan de frutos de mar. Esto incluye el salmn, la [place], el arenque y las [place]. Lcteos [employer] a  3 tazas de lcteos descremados o con bajo contenido de Museum/gallery curator. Algunos ejemplos de equivalentes a 1 taza de lcteos son 1 taza (240 ml) de leche, 8 onzas (250 g) de yogur, 1 onzas (44 g) de queso natural o 1 taza (240 ml) de leche de soja fortificada. Grasas y aceites Propngase consumir alrededor de 5 cucharaditas (21 g) por da. Elija grasas monoinsaturadas, como el aceite de canola y de [place], [place], [place] de man y Games developer de los frutos secos, o bien grasas poliinsaturadas, como el aceite de [place], maz y soja, nueces, piones, semillas de ssamo, semillas de girasol y semillas de lino. Bebidas Propngase beber seis vasos de 8 onzas de Warehouse manager. Limite el caf a entre tres y cinco tazas de 8 onzas por Futures trader. Limite el consumo de bebidas con cafena que tengan caloras agregadas, como los refrescos y las bebidas energizantes. Limite el consumo de alcohol a no ms de 1 medida por da si es mujer y no est Orthoptist, y a 2 medidas por da si es hombre. Una medida equivale a 12 onzas de cerveza (355 ml), 5 onzas de vino (148 ml) o 1 onzas de bebidas alcohlicas de alta graduacin (44 ml). Condimentos y otros alimentos Evite agregar cantidades excesivas de sal a los alimentos. Pruebe darles sabor con hierbas y especias en lugar de sal. Evite agregar azcar a los alimentos. Pruebe usar alios, salsas y productos untables a base de aceite  en lugar de grasas slidas. Esta informacin se basa en las pautas generales de nutricin de los [place]. Para obtener ms informacin, visite [email or web address]. Las cantidades exactas pueden variar en funcin de sus necesidades nutricionales. Resumen Un plan de alimentacin saludable puede ayudarlo a mantener un peso saludable, reducir el riesgo de tener enfermedades crnicas y [place] activo durante toda su vida. Planifique sus comidas. Asegrese de consumir las porciones correctas de una variedad de alimentos ricos en nutrientes. En lugar  de frer, trate de cocinar en el horno, en la plancha o en la parrilla, o hervir los alimentos. Elija opciones saludables en todos los mbitos, como en el hogar, el trabajo, la [place], los restaurantes y [place]. Esta informacin no tiene Theme park manager el consejo del mdico. Asegrese de hacerle al mdico cualquier pregunta que tenga. Document Revised: 06/04/2021 Document Reviewed: 06/04/2021 Elsevier Patient Education  2023 Elsevier Inc.  -----------------------------------------------------------------  Escleroterapia Aprender cmo se realiza la escleroterapia como tratamiento para las venas varicosas. En este video, tambin se describen las precauciones que debera tomar antes y despus de un tratamiento de Regulatory affairs officer. To view the content, go to this web address: [email or web address]  This video will expire on: 01/04/2024. If you need access to this video following this date, please reach out to the healthcare provider who assigned it to you. This information is not intended to replace advice given to you by your health care provider. Make sure you discuss any questions you have with your health care provider. Elsevier Patient Education  2023 [employer].

## 2022-06-21 ENCOUNTER — Other Ambulatory Visit (HOSPITAL_COMMUNITY): Payer: Self-pay | Admitting: Obstetrics and Gynecology

## 2022-06-21 DIAGNOSIS — N6310 Unspecified lump in the right breast, unspecified quadrant: Secondary | ICD-10-CM

## 2022-07-29 ENCOUNTER — Inpatient Hospital Stay: Payer: Self-pay | Attending: Hematology | Admitting: Hematology and Oncology

## 2022-07-29 VITALS — BP 110/56 | Wt 184.3 lb

## 2022-07-29 DIAGNOSIS — N6311 Unspecified lump in the right breast, upper outer quadrant: Secondary | ICD-10-CM

## 2022-07-29 NOTE — Patient Instructions (Addendum)
Taught Carol Huff breast self awareness. Patient did not need a Pap smear today due to last Pap smear was in 2019 per patient. Let her know [facility] will cover Pap smears every 5 years unless has a history of abnormal Pap smears. Referred patient to the [facility] for diagnostic mammogram. Appointment scheduled for 08/02/22. Patient aware of appointment and will be there. Let patient know will follow up with her within the next couple weeks with results. She will return to clinic in one year for repeat mammogram and pap smear. Carol Huff verbalized understanding.  Melodye Ped, NP @T @ 9:04 AM

## 2022-07-29 NOTE — Progress Notes (Signed)
Carol Huff is a 46 y.o. female who presents to [facility] clinic today with complaint of right breast lump for the last 3 years.    Pap Smear: Pap not smear completed today. Last Pap smear was 2019 at [place]. clinic and was normal. Per patient has no history of an abnormal Pap smear. Last Pap smear result is available in Epic.   Physical exam: Breasts Breasts symmetrical. No skin abnormalities bilateral breasts. No nipple retraction bilateral breasts. No nipple discharge bilateral breasts. No lymphadenopathy. Right outer, upper quadrant with 1 cm cyst-like area noted.  MS DIGITAL SCREENING TOMO BILATERAL  Result Date: 05/22/2021 CLINICAL DATA:  Screening. EXAM: DIGITAL SCREENING BILATERAL MAMMOGRAM WITH TOMOSYNTHESIS AND CAD TECHNIQUE: Bilateral screening digital craniocaudal and mediolateral oblique mammograms were obtained. Bilateral screening digital breast tomosynthesis was performed. The images were evaluated with computer-aided detection. COMPARISON:  None. ACR Breast Density Category d: The breast tissue is extremely dense, which lowers the sensitivity of mammography. FINDINGS: There are no findings suspicious for malignancy. IMPRESSION: No mammographic evidence of malignancy. A result letter of this screening mammogram will be mailed directly to the patient. RECOMMENDATION: Screening mammogram in one year. (Code:SM-B-01Y) BI-RADS CATEGORY  1: Negative. Electronically Signed   By: Ammie Ferrier M.D.   On: 05/22/2021 07:59      Pelvic/Bimanual Pap is not indicated today    Smoking History: Patient has never smoked and was not referred to quit line.    Patient Navigation: Patient education provided. Access to services provided for patient through [place] interpreter provided. No transportation provided   Colorectal Cancer Screening: Per patient has never had colonoscopy completed No complaints today.    Breast and Cervical  Cancer Risk Assessment: Patient does not have family history of breast cancer, known genetic mutations, or radiation treatment to the chest before age 89. Patient does not have history of cervical dysplasia, immunocompromised, or DES exposure in-utero.  Risk Assessment   No risk assessment data     A: [facility]CP exam without pap smear Complaint of right breast lump that is tender to exam. This has been present for 3 years and has not changed.  P: [facility]. Appointment scheduled 08/02/22.  Dayton Scrape A, NP 07/29/2022 9:04 AM

## 2022-08-02 ENCOUNTER — Other Ambulatory Visit (HOSPITAL_COMMUNITY): Payer: Self-pay | Admitting: Obstetrics and Gynecology

## 2022-08-02 ENCOUNTER — Ambulatory Visit (HOSPITAL_COMMUNITY)
Admission: RE | Admit: 2022-08-02 | Discharge: 2022-08-02 | Disposition: A | Discharge status: Home / Self Care | Payer: Self-pay | Source: Ambulatory Visit

## 2022-08-02 ENCOUNTER — Ambulatory Visit (HOSPITAL_COMMUNITY)
Admission: RE | Admit: 2022-08-02 | Discharge: 2022-08-02 | Disposition: A | Discharge status: Home / Self Care | Payer: Self-pay | Source: Ambulatory Visit | Attending: Obstetrics and Gynecology | Admitting: Obstetrics and Gynecology

## 2022-08-02 DIAGNOSIS — N6310 Unspecified lump in the right breast, unspecified quadrant: Secondary | ICD-10-CM

## 2022-11-03 ENCOUNTER — Ambulatory Visit: Payer: Self-pay | Admitting: Physician Assistant

## 2022-11-10 ENCOUNTER — Ambulatory Visit: Payer: Self-pay | Admitting: Physician Assistant

## 2022-11-10 ENCOUNTER — Encounter: Payer: Self-pay | Admitting: Physician Assistant

## 2022-11-10 VITALS — BP 107/64 | HR 80 | Temp 97.9°F | Ht 59.5 in | Wt 189.0 lb

## 2022-11-10 DIAGNOSIS — Z789 Other specified health status: Secondary | ICD-10-CM

## 2022-11-10 DIAGNOSIS — R319 Hematuria, unspecified: Secondary | ICD-10-CM

## 2022-11-10 LAB — POCT URINALYSIS DIPSTICK
Bilirubin, UA: NEGATIVE
Blood, UA: NEGATIVE
Glucose, UA: NEGATIVE
Ketones, UA: NEGATIVE
Leukocytes, UA: NEGATIVE
Nitrite, UA: NEGATIVE
Protein, UA: NEGATIVE
Spec Grav, UA: >=1.03 — AB (ref 1.010–1.025)
Urobilinogen, UA: 0.2 E.U./dL
pH, UA: 5.5 (ref 5.0–8.0)

## 2022-11-10 NOTE — Progress Notes (Signed)
BP 107/64   Pulse 80   Temp 97.9 F (36.6 C)   Ht 4' 11.5" (1.511 m)   Wt 189 lb (85.7 kg)   SpO2 99%   BMI 37.53 kg/m    Subjective:    Patient ID: Carol Huff, female    DOB: 1976-10-13, 47 y.o.   MRN: [id number]  HPI: Carol Huff is a 47 y.o. female presenting on 11/10/2022 for Dysuria (Pt states she felt burning when she urinated on 10/17/22 (one week before her menses) pt states she had urge and frequency. Pt states then her menses started on 10/24/22 where she bleed "abundantly" and it lasted 10 when it usually last 5-6 days. During her mensus pt did not feel burning when she urinated. Pt states now she has some burning some times when she urinates.)   HPI  Chief Complaint  Patient presents with   Dysuria    Pt states she felt burning when she urinated on 10/17/22 (one week before her menses) pt states she had urge and frequency. Pt states then her menses started on 10/24/22 where she bleed "abundantly" and it lasted 10 when it usually last 5-6 days. During her mensus pt did not feel burning when she urinated. Pt states now she has some burning some times when she urinates.     Now she is not having any burning or pain when she urinates.  It happens sometimes but not now.  She doesn't drink much sodas.   She is having No abdominal pain  She has normal menses but last one was a bit longer.     Relevant past medical, surgical, family and social history reviewed and updated as indicated. Interim medical history since our last visit reviewed. Allergies and medications reviewed and updated.   Current Outpatient Medications:    acyclovir (ZOVIRAX) 400 MG tablet, Take 1 tablet (400 mg total) by mouth 2 (two) times daily., Disp: 60 tablet, Rfl: 11    Review of Systems  Per HPI unless specifically indicated above     Objective:    BP 107/64   Pulse 80   Temp 97.9 F (36.6 C)   Ht 4' 11.5" (1.511 m)   Wt 189 lb (85.7 kg)   SpO2 99%   BMI 37.53  kg/m   Wt Readings from Last 3 Encounters:  11/10/22 189 lb (85.7 kg)  07/29/22 184 lb 4.8 oz (83.6 kg)  04/27/22 190 lb (86.2 kg)    Physical Exam Vitals reviewed.  Constitutional:      General: She is not in acute distress.    Appearance: She is well-developed. She is obese. She is not ill-appearing.  HENT:     Head: Normocephalic and atraumatic.  Cardiovascular:     Rate and Rhythm: Normal rate and regular rhythm.  Pulmonary:     Effort: Pulmonary effort is normal.     Breath sounds: Normal breath sounds.  Abdominal:     General: Bowel sounds are normal.     Palpations: Abdomen is soft. There is no mass.     Tenderness: There is no abdominal tenderness. There is no right CVA tenderness, left CVA tenderness, guarding or rebound.  Musculoskeletal:     Cervical back: Neck supple.     Right lower leg: No edema.     Left lower leg: No edema.  Lymphadenopathy:     Cervical: No cervical adenopathy.  Skin:    General: Skin is warm and dry.  Neurological:  Mental Status: She is alert and oriented to person, place, and time.  Psychiatric:        Behavior: Behavior normal.     Results for orders placed or performed in visit on 11/10/22  POCT Urinalysis Dipstick  Result Value Ref Range   Color, UA YELLOW    Clarity, UA CLEAR    Glucose, UA Negative Negative   Bilirubin, UA NEG    Ketones, UA NEG    Spec Grav, UA >=1.030 (A) 1.010 - 1.025   Blood, UA NEG    pH, UA 5.5 5.0 - 8.0   Protein, UA Negative Negative   Urobilinogen, UA 0.2 0.2 or 1.0 E.U./dL   Nitrite, UA NEG    Leukocytes, UA Negative Negative   Appearance     Odor        Assessment & Plan:   Encounter Diagnoses  Name Primary?   Hematuria, unspecified type Yes   Not proficient in English language      -Reassured pt UA was normal -Discussed menses and perimenopause.  She was given reading information.

## 2022-11-10 NOTE — Patient Instructions (Signed)
Perimenopausia Perimenopause La perimenopausia es el momento normal de la vida de una mujer en el que los niveles de [place], la hormona femenina producida por los ovarios, comienzan a disminuir. Esto provoca cambios en los perodos menstruales antes de que se detengan por completo (menopausia). La perimenopausia puede comenzar de 2 a 8 aos antes de [employer]. Durante la perimenopausia, los ovarios pueden o no producir vulos y Retail banker an puede quedar [place]. Cules son las causas? La perimenopausia es causada por un cambio natural en los niveles hormonales que se produce a medida que las mujeres envejecen. Qu incrementa el riesgo? Es ms probable que la perimenopausia comience a una edad ms temprana si tiene ciertas afecciones o se ha realizado ciertos tratamientos, incluidos los siguientes: Un tumor en la hipfisis del cerebro. Una enfermedad que afecte los ovarios y la produccin de hormonas. Ciertos tratamientos para el cncer, como quimioterapia o terapia hormonal, o radioterapia en la pelvis. Fumar mucho y consumir alcohol de forma excesiva. Antecedentes familiares de menopausia temprana. Cules son los signos o sntomas? Los cambios por la perimenopausia afectan de [place] diferente a Information systems manager. Los sntomas de esta afeccin pueden incluir los siguientes: Nurse, learning disability. Perodos menstruales irregulares. Sudoracin nocturna. Cambios en el sentimiento respecto de las [employer]. Es posible que el deseo sexual disminuya o que se sienta ms incmoda respecto de su sexualidad. Sequedad vaginal. Dolores de cabeza. Cambios en el estado de nimo. Depresin. Problemas para dormir (insomnio). Problemas de memoria o dificultad para concentrarse. Irritabilidad. Cansancio. Aumento de [place]. Ansiedad. Problemas para quedar embarazada. Cmo se diagnostica? Esta afeccin se diagnostica en funcin de los antecedentes mdicos, un examen fsico, la edad, los antecedentes  menstruales y los sntomas. Tambin le podran realizar estudios hormonales. Cmo se trata? En algunos los casos, no se necesita tratamiento. Usted y el mdico deben decidir juntos si se debe Arts administrator. El tratamiento se determinar en funcin de su cuadro clnico y de sus preferencias. Existen varios tratamientos disponibles, como: Terapia hormonal para la menopausia. Medicamentos para tratar sntomas especficos. Acupuntura. Vitaminas o suplementos herbales. Antes de Biochemist, clinical, es importante que le avise al mdico si tiene antecedentes personales o familiares de lo siguiente: Enfermedad cardaca. Cncer de mama. Cogulos de [place]. Diabetes. Osteoporosis. Siga estas instrucciones en su casa: Medicamentos Use los medicamentos de venta libre y los recetados solamente como se lo haya indicado el mdico. Tome los suplementos vitamnicos solamente como se lo haya indicado el mdico. Hable con el mdico antes de Art gallery manager a tomar cualquier suplemento herbal. Estilo de vida  No consuma ningn producto que contenga nicotina o tabaco, como cigarrillos, cigarrillos electrnicos y tabaco de Higher education careers adviser. Si necesita ayuda para dejar de consumir estos productos, consulte al mdico. Realice, por lo menos, 30 minutos de actividad fsica 5 das por semana o ms. Siga una dieta equilibrada que incluya frutas y verduras frescas, cereales integrales, soja, huevos, carnes magras y lcteos descremados. Evite las bebidas con alcohol o cafena, as como las [employer]. Esto podra ayudar a prevenir los acaloramientos. Intente dormir de 7 a 8 horas todas las noches. Vstase con capas de prendas que pueda quitarse para Automotive engineer. Encuentre modos de [employer], por ejemplo, a travs de la respiracin, meditacin o un diario ntimo. Indicaciones generales  Lleve un registro de sus perodos menstruales; incluya lo siguiente: El momento en que  ocurren. Qu tan abundantes son y cunto duran. Cunto tiempo transcurre entre cada perodo menstrual. Lleve un registro de  los sntomas; anote cundo comienzan, con qu frecuencia ocurren y cunto duran. Use lubricantes o humectantes vaginales para aliviar la sequedad vaginal y Research officer, political party [place]. An puede quedar embarazada si tiene periodos irregulares. Asegrese de [employer] anticonceptivos durante la perimenopausia si no desea quedar embarazada. Cumpla con todas las visitas de seguimiento. Esto es importante. Esto incluye la terapia grupal y la psicoterapia. Comunquese con un mdico si: Tiene una hemorragia vaginal abundante o despide cogulos de sangre. Su perodo menstrual dura ms de 2 das que lo habitual. Sus perodos [employer]ogg se producen con Transport planner que 21 das. Sangra despus de Merrill Lynch. Siente dolor durante las [employer]. Solicite ayuda de inmediato si tiene: Tourist information centre manager, dificultad para respirar o dificultad para hablar. Depresin grave. Dolor al [place]. Dolor de cabeza intenso. Problemas de visin. Resumen La perimenopausia es el perodo en el cual el cuerpo de la mujer comienza a pasar a la menopausia. Esto puede suceder naturalmente o como consecuencia de otros problemas de salud o tratamientos mdicos. La perimenopausia puede [employer] 2 y 61 aos antes de la menopausia y suele durar varios aos. Los sntomas de la perimenopausia pueden controlarse con medicamentos, cambios en el estilo de vida y tratamientos complementarios, como la acupuntura. Esta informacin no tiene Marine scientist el consejo del mdico. Asegrese de hacerle al mdico cualquier pregunta que tenga. Document Revised: 05/05/2020 Document Reviewed: 05/05/2020 Elsevier Patient Education  [place].

## 2023-04-12 ENCOUNTER — Other Ambulatory Visit: Payer: Self-pay | Admitting: Physician Assistant

## 2023-04-12 DIAGNOSIS — Z13220 Encounter for screening for lipoid disorders: Secondary | ICD-10-CM

## 2023-04-28 ENCOUNTER — Other Ambulatory Visit (HOSPITAL_COMMUNITY)
Admission: RE | Admit: 2023-04-28 | Discharge: 2023-04-28 | Disposition: A | Discharge status: Home / Self Care | Payer: Self-pay | Source: Ambulatory Visit | Attending: Physician Assistant | Admitting: Physician Assistant

## 2023-04-28 DIAGNOSIS — Z13220 Encounter for screening for lipoid disorders: Secondary | ICD-10-CM

## 2023-04-28 LAB — COMPREHENSIVE METABOLIC PANEL
ALT: 19 U/L (ref 0–44)
AST: 22 U/L (ref 15–41)
Albumin: 3.3 g/dL — ABNORMAL LOW (ref 3.5–5.0)
Alkaline Phosphatase: 73 U/L (ref 38–126)
Anion gap: 8 (ref 5–15)
BUN: 12 mg/dL (ref 6–20)
CO2: 21 mmol/L — ABNORMAL LOW (ref 22–32)
Calcium: 8.2 mg/dL — ABNORMAL LOW (ref 8.9–10.3)
Chloride: 105 mmol/L (ref 98–111)
Creatinine, Ser: 0.78 mg/dL (ref 0.44–1.00)
GFR, Estimated: >60 mL/min (ref >60)
Glucose, Bld: 104 mg/dL — ABNORMAL HIGH (ref 70–99)
Potassium: 3.5 mmol/L (ref 3.5–5.1)
Sodium: 134 mmol/L — ABNORMAL LOW (ref 135–145)
Total Bilirubin: 0.7 mg/dL (ref 0.3–1.2)
Total Protein: 7.4 g/dL (ref 6.5–8.1)

## 2023-04-28 LAB — LIPID PANEL
Cholesterol: 192 mg/dL (ref 0–200)
HDL: 54 mg/dL (ref >40)
LDL Cholesterol: 113 mg/dL — ABNORMAL HIGH (ref 0–99)
Total CHOL/HDL Ratio: 3.6 RATIO
Triglycerides: 124 mg/dL (ref <150)
VLDL: 25 mg/dL (ref 0–40)

## 2023-05-03 ENCOUNTER — Ambulatory Visit: Payer: Self-pay | Admitting: Physician Assistant

## 2023-05-04 ENCOUNTER — Ambulatory Visit: Payer: Self-pay | Admitting: Physician Assistant

## 2023-05-11 ENCOUNTER — Other Ambulatory Visit (HOSPITAL_COMMUNITY)
Admission: RE | Admit: 2023-05-11 | Discharge: 2023-05-11 | Disposition: A | Discharge status: Home / Self Care | Payer: Self-pay | Source: Ambulatory Visit | Attending: Physician Assistant | Admitting: Physician Assistant

## 2023-05-11 ENCOUNTER — Ambulatory Visit: Payer: Self-pay | Admitting: Physician Assistant

## 2023-05-11 ENCOUNTER — Encounter: Payer: Self-pay | Admitting: Physician Assistant

## 2023-05-11 VITALS — BP 111/73 | HR 78 | Temp 97.7°F | Wt 191.5 lb

## 2023-05-11 DIAGNOSIS — Z124 Encounter for screening for malignant neoplasm of cervix: Secondary | ICD-10-CM | POA: Insufficient documentation

## 2023-05-11 DIAGNOSIS — N9089 Other specified noninflammatory disorders of vulva and perineum: Secondary | ICD-10-CM

## 2023-05-11 DIAGNOSIS — Z789 Other specified health status: Secondary | ICD-10-CM

## 2023-05-11 DIAGNOSIS — Z0000 Encounter for general adult medical examination without abnormal findings: Secondary | ICD-10-CM

## 2023-05-11 DIAGNOSIS — E669 Obesity, unspecified: Secondary | ICD-10-CM

## 2023-05-11 MED ORDER — ACYCLOVIR 400 MG PO TABS: 400.0000 mg | ORAL_TABLET | Freq: Two times a day (BID) | ORAL | 11 refills | Status: DC

## 2023-05-11 NOTE — Progress Notes (Signed)
BP 111/73   Pulse 78   Temp 97.7 F (36.5 C)   Wt 191 lb 8 oz (86.9 kg)   SpO2 98%   BMI 38.03 kg/m    Subjective:    Patient ID: Carol Huff, female    DOB: 1976/07/12, 47 y.o.   MRN: [id number]  HPI: Carol Huff is a 47 y.o. female presenting on 05/11/2023 for Annual Exam and Gynecologic Exam   HPI   Chief Complaint  Patient presents with   Annual Exam   Gynecologic Exam    Pt is 46yoF who is in today for annual well exam.   She is not exercising regularly but does occassionally.  She denies cp, sob, abdominal pain, depression, anxiety.    Her only complaint today is recurrence of labial lesion.  She had pyogenic granuloma excision by dermatology in 2019.  Pt says it is coming back.  It has Not been bleeding.  She says it doesn't really hurt but is more like irritatting      Relevant past medical, surgical, family and social history reviewed and updated as indicated. Interim medical history since our last visit reviewed. Allergies and medications reviewed and updated.   Current Outpatient Medications:    acyclovir (ZOVIRAX) 400 MG tablet, Take 1 tablet (400 mg total) by mouth 2 (two) times daily., Disp: 60 tablet, Rfl: 11    Review of Systems  Per HPI unless specifically indicated above     Objective:    BP 111/73   Pulse 78   Temp 97.7 F (36.5 C)   Wt 191 lb 8 oz (86.9 kg)   SpO2 98%   BMI 38.03 kg/m   Wt Readings from Last 3 Encounters:  05/11/23 191 lb 8 oz (86.9 kg)  11/10/22 189 lb (85.7 kg)  07/29/22 184 lb 4.8 oz (83.6 kg)    Physical Exam Vitals and nursing note reviewed. Exam conducted with a chaperone present.  Constitutional:      General: She is not in acute distress.    Appearance: She is well-developed. She is obese. She is not toxic-appearing.  HENT:     Head: Normocephalic and atraumatic.     Right Ear: Tympanic membrane, ear canal and external ear normal.     Left Ear: Tympanic membrane, ear canal and  external ear normal.     Mouth/Throat:     Mouth: Oropharynx is clear and moist.  Eyes:     Extraocular Movements: Extraocular movements intact and EOM normal.     Conjunctiva/sclera: Conjunctivae normal.     Pupils: Pupils are equal, round, and reactive to light.  Neck:     Thyroid: No thyromegaly.  Cardiovascular:     Rate and Rhythm: Normal rate and regular rhythm.  Pulmonary:     Effort: Pulmonary effort is normal.     Breath sounds: Normal breath sounds.  Chest:  Breasts:    Right: Normal.     Left: Normal.  Abdominal:     General: Bowel sounds are normal.     Palpations: Abdomen is soft. There is no mass.     Tenderness: There is no abdominal tenderness. There is no guarding or rebound.  Genitourinary:    Labia:        Right: No rash, tenderness or lesion.        Left: Lesion present. No rash or tenderness.      Vagina: Normal.     Cervix: No cervical motion tenderness, discharge or friability.  Adnexa:        Right: No mass, tenderness or fullness.         Left: No mass, tenderness or fullness.       Comments: Child psychotherapist assisted) Lesion left labia Musculoskeletal:        General: No edema.     Right shoulder: Normal range of motion.     Left shoulder: Normal range of motion.     Right elbow: Normal range of motion.     Left elbow: Normal range of motion.     Cervical back: Neck supple. Normal range of motion.     Thoracic back: Normal range of motion.     Lumbar back: Normal range of motion.     Right lower leg: No edema.     Left lower leg: No edema.  Lymphadenopathy:     Cervical: No cervical adenopathy.  Skin:    General: Skin is warm and dry.  Neurological:     Mental Status: She is alert and oriented to person, place, and time.     Motor: No weakness or tremor.     Gait: Gait normal.     Deep Tendon Reflexes:     Reflex Scores:      Patellar reflexes are 2+ on the right side and 2+ on the left side. Psychiatric:        Attention and  Perception: Attention normal.        Mood and Affect: Mood and affect and mood normal.        Speech: Speech normal.        Behavior: Behavior normal. Behavior is cooperative.     Results for orders placed or performed during the hospital encounter of 04/28/23  Lipid panel  Result Value Ref Range   Cholesterol 192 0 - 200 mg/dL   Triglycerides 308 <657 mg/dL   HDL 54 >84 mg/dL   Total CHOL/HDL Ratio 3.6 RATIO   VLDL 25 0 - 40 mg/dL   LDL Cholesterol 696 (H) 0 - 99 mg/dL  Comprehensive metabolic panel  Result Value Ref Range   Sodium 134 (L) 135 - 145 mmol/L   Potassium 3.5 3.5 - 5.1 mmol/L   Chloride 105 98 - 111 mmol/L   CO2 21 (L) 22 - 32 mmol/L   Glucose, Bld 104 (H) 70 - 99 mg/dL   BUN 12 6 - 20 mg/dL   Creatinine, Ser 2.95 0.44 - 1.00 mg/dL   Calcium 8.2 (L) 8.9 - 10.3 mg/dL   Total Protein 7.4 6.5 - 8.1 g/dL   Albumin 3.3 (L) 3.5 - 5.0 g/dL   AST 22 15 - 41 U/L   ALT 19 0 - 44 U/L   Alkaline Phosphatase 73 38 - 126 U/L   Total Bilirubin 0.7 0.3 - 1.2 mg/dL   GFR, Estimated >28 >41 mL/min   Anion gap 8 5 - 15      Assessment & Plan:    Encounter Diagnoses  Name Primary?   Annual physical exam Yes   Routine cervical smear    Lesion of labia    Class 2 obesity with body mass index (BMI) of 38.0 to 38.9 in adult, unspecified obesity type, unspecified whether serious comorbidity present    Not proficient in [place] language      -will refer for screening Mammogram which is due in October -pt is given FIT test for colon cancer screening -her PAP was updated today -discussed her labial lesion and she is ready  to have it removed again.  Will refer back to dermatology who removed it 5 years ago -reviewed labs with pt  -encouraged healthy, lowfat Diet and regular exercise for lipids and weight management -her acyclovir is refilled -pt to follow up for annual exam in 12 months.  She is to contact office sooner prn

## 2023-06-06 ENCOUNTER — Other Ambulatory Visit: Payer: Self-pay | Admitting: Obstetrics and Gynecology

## 2023-06-06 DIAGNOSIS — Z1231 Encounter for screening mammogram for malignant neoplasm of breast: Secondary | ICD-10-CM

## 2023-08-07 ENCOUNTER — Ambulatory Visit (HOSPITAL_COMMUNITY)
Admission: RE | Admit: 2023-08-07 | Discharge: 2023-08-07 | Disposition: A | Discharge status: Home / Self Care | Payer: Self-pay | Source: Ambulatory Visit | Attending: Obstetrics and Gynecology | Admitting: Obstetrics and Gynecology

## 2023-08-07 ENCOUNTER — Encounter (HOSPITAL_COMMUNITY): Payer: Self-pay

## 2023-08-07 DIAGNOSIS — Z1231 Encounter for screening mammogram for malignant neoplasm of breast: Secondary | ICD-10-CM | POA: Insufficient documentation

## 2023-08-14 ENCOUNTER — Other Ambulatory Visit (HOSPITAL_COMMUNITY): Payer: Self-pay | Admitting: Obstetrics and Gynecology

## 2023-08-14 DIAGNOSIS — R928 Other abnormal and inconclusive findings on diagnostic imaging of breast: Secondary | ICD-10-CM

## 2023-08-25 ENCOUNTER — Inpatient Hospital Stay: Payer: Self-pay | Attending: Obstetrics and Gynecology | Admitting: Hematology and Oncology

## 2023-08-25 VITALS — BP 127/73 | Wt 194.0 lb

## 2023-08-25 DIAGNOSIS — N6311 Unspecified lump in the right breast, upper outer quadrant: Secondary | ICD-10-CM

## 2023-08-25 NOTE — Progress Notes (Signed)
[name] is a 47 y.o. female who presents to [facility] clinic today with no complaints. Follow up possible left breast asymmetry.    Pap Smear: Pap not smear completed today. Last Pap smear was 05/11/2023 at [facility] clinic and was abnormal - HPV+ . Per patient has no history of an abnormal Pap smear. Last Pap smear result is available in Epic. Has been instructed per [facility] to repeat Pap smear there next year.    Physical exam: Breasts Breasts symmetrical. No skin abnormalities bilateral breasts. No nipple retraction bilateral breasts. No nipple discharge bilateral breasts. No lymphadenopathy. No lumps palpated bilateral breasts.     MS 3D SCR MAMMO BILAT BR (aka MM)  Result Date: 08/09/2023 CLINICAL DATA:  Screening. EXAM: DIGITAL SCREENING BILATERAL MAMMOGRAM WITH TOMOSYNTHESIS AND CAD TECHNIQUE: Bilateral screening digital craniocaudal and mediolateral oblique mammograms were obtained. Bilateral screening digital breast tomosynthesis was performed. The images were evaluated with computer-aided detection. COMPARISON:  Previous exam(s). ACR Breast Density Category d: The breasts are extremely dense, which lowers the sensitivity of mammography. FINDINGS: In the left breast, a possible asymmetry warrants further evaluation. In the right breast, no findings suspicious for malignancy. IMPRESSION: Further evaluation is suggested for possible asymmetry in the left breast. RECOMMENDATION: Diagnostic mammogram and possibly ultrasound of the left breast. (Code:FI-L-30M) The patient will be contacted regarding the findings, and additional imaging will be scheduled. BI-RADS CATEGORY  0: Incomplete: Need additional imaging evaluation. Electronically Signed   By: Frederico Hamman M.D.   On: 08/09/2023 09:15   MS DIGITAL DIAG TOMO BILAT  Result Date: 08/02/2022 CLINICAL DATA:  [age] year female with a palpable area of concern in the right breast she has been feeling for the past 3 years. EXAM: DIGITAL  DIAGNOSTIC BILATERAL MAMMOGRAM WITH TOMOSYNTHESIS; ULTRASOUND RIGHT BREAST LIMITED TECHNIQUE: Bilateral digital diagnostic mammography and breast tomosynthesis was performed.; Targeted ultrasound examination of the right breast was performed COMPARISON:  Previous exam(s). ACR Breast Density Category d: The breast tissue is extremely dense, which lowers the sensitivity of mammography. FINDINGS: Several round and oval circumscribed masses are present throughout the right breast. Spot compression tangential tomograms were performed over the palpable area in the right breast demonstrating an oval circumscribed mass with obscured margins. No suspicious masses or calcifications are seen in the left breast. Targeted ultrasound was performed the site of palpable concern demonstrating a septated cyst at the 9:30 position 1 cm from the nipple measuring 0.5 x 0.5 x 0.4 cm. In addition innumerable cysts are identified within the outer right breast. No suspicious masses or any other worrisome abnormalities identified sonographically. IMPRESSION: Right breast cysts.  No findings of malignancy in either breast. RECOMMENDATION: Screening mammogram in one year.(Code:SM-B-01Y) I have discussed the findings and recommendations with the patient. If applicable, a reminder letter will be sent to the patient regarding the next appointment. BI-RADS CATEGORY  2: Benign. Electronically Signed   By: Edwin Cap M.D.   On: 08/02/2022 11:43   MS DIGITAL SCREENING TOMO BILATERAL  Result Date: 05/22/2021 CLINICAL DATA:  Screening. EXAM: DIGITAL SCREENING BILATERAL MAMMOGRAM WITH TOMOSYNTHESIS AND CAD TECHNIQUE: Bilateral screening digital craniocaudal and mediolateral oblique mammograms were obtained. Bilateral screening digital breast tomosynthesis was performed. The images were evaluated with computer-aided detection. COMPARISON:  None. ACR Breast Density Category d: The breast tissue is extremely dense, which lowers the sensitivity of  mammography. FINDINGS: There are no findings suspicious for malignancy. IMPRESSION: No mammographic evidence of malignancy. A result letter of this screening mammogram will  be mailed directly to the patient. RECOMMENDATION: Screening mammogram in one year. (Code:SM-B-01Y) BI-RADS CATEGORY  1: Negative. Electronically Signed   By: Frederico Hamman M.D.   On: 05/22/2021 07:59      Pelvic/Bimanual Pap is not indicated today    Smoking History: Patient has never smoked and was not referred to quit line.    Patient Navigation: Patient education provided. Access to services provided for patient through B[facility]CP program. Angelique Raizen-Cepeda (CAP) interpreter provided. No transportation provided   Colorectal Cancer Screening: Per patient has never had colonoscopy completed No complaints today. FIT test July 2024   Breast and Cervical Cancer Risk Assessment: Patient does not have family history of breast cancer, known genetic mutations, or radiation treatment to the chest before age 3. Patient does not have history of cervical dysplasia, immunocompromised, or DES exposure in-utero.  Risk Scores as of Encounter on 08/25/2023     Dondra Spry           5-year 0.76%   Lifetime 8.52%   This patient is [place]/Lat[place] but has no documented birth country, so the [place] model used data from [place] patients to calculate their risk score. Document a birth country in the Demographics activity for a more accurate score.         Last calculated by Narda Rutherford, LPN on 40/06/8118 at 10:58 AM          A: BCCCP exam without pap smear No complaints with benign exam. Follow up possible left breast asymmetry.  P: [facility] to [facility] for a diagnostic mammogram. Appointment scheduled 08/31/2023.  Pascal Lux, NP 08/25/2023 10:58 AM

## 2023-08-25 NOTE — Patient Instructions (Signed)
Taught Carol Huff about self breast awareness and gave educational materials to take home. Patient did not need a Pap smear today due to last Pap smear was in 05/11/2023 per patient.  Let her know [facility] will cover Pap smears every 5 years unless has a history of abnormal Pap smears. Referred [facility] to [facility] for diagnostic mammogram. Appointment scheduled for 08/31/2023. Patient aware of appointment and will be there. Let patient know will follow up with her within the next couple weeks with results. Carol Huff verbalized understanding.  Pascal Lux, NP 11:12 AM

## 2023-08-31 ENCOUNTER — Ambulatory Visit (HOSPITAL_COMMUNITY)
Admission: RE | Admit: 2023-08-31 | Discharge: 2023-08-31 | Disposition: A | Discharge status: Home / Self Care | Payer: Self-pay | Source: Ambulatory Visit | Attending: Obstetrics and Gynecology | Admitting: Obstetrics and Gynecology

## 2023-08-31 DIAGNOSIS — R928 Other abnormal and inconclusive findings on diagnostic imaging of breast: Secondary | ICD-10-CM

## 2023-10-05 ENCOUNTER — Encounter: Payer: Self-pay | Admitting: Physician Assistant

## 2024-05-09 ENCOUNTER — Ambulatory Visit: Payer: Self-pay | Admitting: Physician Assistant

## 2024-05-14 ENCOUNTER — Ambulatory Visit: Payer: Self-pay | Admitting: Physician Assistant

## 2024-05-15 ENCOUNTER — Other Ambulatory Visit: Payer: Self-pay | Admitting: Physician Assistant

## 2024-05-21 ENCOUNTER — Encounter: Payer: Self-pay | Admitting: Physician Assistant

## 2024-05-21 ENCOUNTER — Ambulatory Visit: Payer: Self-pay | Admitting: Physician Assistant

## 2024-05-21 VITALS — BP 99/62 | HR 94 | Temp 97.9°F | Ht 59.5 in | Wt 191.0 lb

## 2024-05-21 DIAGNOSIS — Z0000 Encounter for general adult medical examination without abnormal findings: Secondary | ICD-10-CM

## 2024-05-21 DIAGNOSIS — Z789 Other specified health status: Secondary | ICD-10-CM

## 2024-05-21 DIAGNOSIS — Z1239 Encounter for other screening for malignant neoplasm of breast: Secondary | ICD-10-CM

## 2024-05-21 MED ORDER — ACYCLOVIR 400 MG PO TABS: 400.0000 mg | ORAL_TABLET | Freq: Two times a day (BID) | ORAL | 0 refills | Status: DC

## 2024-05-21 NOTE — Patient Instructions (Signed)
 Perimenopausia: qu debe saber Perimenopause: What to Know La perimenopausia es la etapa de la vida en la que los niveles de estrgeno comienzan a disminuir. El estrgeno es la hormona femenina que producen los ovarios. La perimenopausia puede empezar de 2 a 8 aos antes de [employer]. Puede causar cambios en el perodo menstrual. [employer], los ovarios podran fabricar un vulo o no. En muchos casos, an puede quedar embarazada. Cules son las causas? La perimenopausia es un cambio natural en los niveles hormonales que ocurre a medida que pasa el [place]. Qu incrementa el riesgo? Es ms probable que comience la perimenopausia de forma temprana si: Tiene un crecimiento anormal (tumor) de la hipfisis en el cerebro. Tiene una enfermedad que [employer] ovarios. Recibe determinados tratamientos para el cncer. Incluyen lo siguiente: Quimioterapia. Terapia hormonal. Radioterapia en la zona que est entre las caderas (pelvis). Fuma mucho o bebe mucho alcohol. Otras miembros de la familia han pasado por la menopausia de forma temprana. Cules son los signos o sntomas? Los sntomas son nicos para Advertising account planner. Es posible que tenga: Acaloramiento. Perodos irregulares. Sudoracin nocturna. Cambios en cmo se siente con respecto al sexo. Puede tener menos deseo sexual o sentir ms incomodidad en torno a Engineer, building services. Sequedad vaginal. Dolores de cabeza. Cambios en el estado de nimo. Otros sntomas pueden incluir: Depresin. Esta ocurre cuando se siente tristeza o desesperanza. Dificultad para dormir. Problemas de memoria o dificultad para concentrarse. Irritabilidad. Esto significa que se enoja fcilmente. Cansancio. Aumento de [place]. Ansiedad. Esto es sentir preocupacin o nerviosismo. Tambin puede tener dificultad para quedar embarazada. Cmo se diagnostica? Pueden diagnosticarle esta afeccin en funcin de lo siguiente: Sus antecedentes mdicos. Un examen. Su  edad. Sus antecedentes de perodos menstruales. Sus sntomas. Pruebas hormonales. Cmo se trata? En algunos los casos, no se necesita tratamiento. Hable con su mdico acerca de si debera recibir algn tratamiento. Los tratamientos pueden incluir lo siguiente: Terapia hormonal para la menopausia (THM). Medicamentos para tratar ciertos sntomas. Acupuntura. Vitaminas o suplementos herbales. Antes de Microbiologist, informe al mdico si usted o alguien de su familia tiene o ha tenido: Enfermedad cardaca. Cncer de mama. Cogulos de [place]. Diabetes. Osteoporosis. Siga estas instrucciones en su casa: Comida y bebida  Consuma una dieta equilibrada. Debe incluir: Burnadette Carrion y verduras frescas. Cereales integrales. Soja. Huevos. Carne magra. Lcteos con bajo contenido de grasa. Para ayudar a prevenir los acaloramientos, no consuma lo siguiente: Alcohol. Bebidas con cafena. Comidas condimentadas. Estilo de vida No fume, vapee ni consuma nicotina o tabaco. Realice, por lo menos, 30 minutos de actividad fsica 5 das por semana o ms. Intente dormir de 7 a 8 horas todas las noches. Vstase con capas de ropa que puedan quitarse si tiene un acaloramiento. Busque maneras de Charity fundraiser. Tal vez deba intentar lo siguiente: Respiracin profunda. Meditacin. Escribir un diario. Instrucciones generales  Use los medicamentos nicamente segn las indicaciones. Lleve un registro de sus perodos menstruales. Registre: Cundo aparecen. Qu tan abundantes son. Cunto tiempo duran. Cunto tiempo transcurre entre cada perodo menstrual. Lleve un registro de los sntomas. Registre: Cundo comienzan. Con qu frecuencia los tiene. Cunto tiempo duran. Use lubricantes o humectantes vaginales. Estos pueden ayudar con: Sequedad vaginal. La comodidad durante el sexo. An puede quedar embarazada si tiene el perodo menstrual espordicamente. Asegrese de usar un mtodo  anticonceptivo si no desea quedar embarazada. Comunquese con un mdico si: Tiene un perodo muy abundante o le salen cogulos de sangre. Su perodo menstrual dura ms  de 2 [employer] lo habitual. El perodo regresa antes de los [place]. Sangra despus de [employer]. Siente dolor durante el sexo. Siente dolor al [employer]. Tiene dolores de [place] muy intensos. Tiene problemas con la vista. Solicite ayuda de inmediato si: Medical laboratory scientific officer. Tiene dificultad para respirar. Tiene dificultad para hablar. Tiene una depresin muy intensa. Esta informacin no tiene Theme park manager el consejo del mdico. Asegrese de hacerle al mdico cualquier pregunta que tenga. Document Revised: 07/13/2023 Document Reviewed: 07/13/2023 Elsevier Patient Education  2024 [employer].

## 2024-05-21 NOTE — Progress Notes (Signed)
 BP 99/62   Pulse 94   Temp 97.9 F (36.6 C)   Ht 4' 11.5 (1.511 m)   Wt 191 lb (86.6 kg)   SpO2 95%   BMI 37.93 kg/m    Subjective:    Patient ID: Carol Huff, female    DOB: 1976-08-18, 48 y.o.   MRN: [id number]  HPI: Carol Huff is a 48 y.o. female presenting on 05/21/2024 for Annual Exam (Pt states she has had changes with her menstrual cycle. Pt states in December, and january she did not have a menses. In february She bled all month. Since then she bleeds every month, but only for about 3 days when it usually lasted about 5 days. Pt states otherwise she is doing well.)   HPI    Chief Complaint  Patient presents with   Annual Exam    Pt states she has had changes with her menstrual cycle. Pt states in December, and january she did not have a menses. In february She bled all month. Since then she bleeds every month, but only for about 3 days when it usually lasted about 5 days. Pt states otherwise she is doing well.     Pt says her menses have been more normal June and July.   She has appt with [facility] tomorrow to update enrollment She walks 3 or 4 times/week for exercise She feels well and has no complaints     Relevant past medical, surgical, family and social history reviewed and updated as indicated. Interim medical history since our last visit reviewed. Allergies and medications reviewed and updated.   Current Outpatient Medications:    acyclovir  (ZOVIRAX ) 400 MG tablet, Take 1 tablet (400 mg total) by mouth 2 (two) times daily., Disp: 60 tablet, Rfl: 11    Review of Systems  Per HPI unless specifically indicated above     Objective:    BP 99/62   Pulse 94   Temp 97.9 F (36.6 C)   Ht 4' 11.5 (1.511 m)   Wt 191 lb (86.6 kg)   SpO2 95%   BMI 37.93 kg/m   Wt Readings from Last 3 Encounters:  05/21/24 191 lb (86.6 kg)  08/25/23 194 lb 0.1 oz (88 kg)  05/11/23 191 lb 8 oz (86.9 kg)    Physical Exam Vitals reviewed.   Constitutional:      General: She is not in acute distress.    Appearance: She is well-developed. She is obese. She is not ill-appearing or toxic-appearing.  HENT:     Head: Normocephalic and atraumatic.     Right Ear: Tympanic membrane, ear canal and external ear normal.     Left Ear: Tympanic membrane and external ear normal.  Eyes:     Extraocular Movements: Extraocular movements intact.     Conjunctiva/sclera: Conjunctivae normal.     Pupils: Pupils are equal, round, and reactive to light.  Neck:     Thyroid: No thyromegaly.  Cardiovascular:     Rate and Rhythm: Normal rate and regular rhythm.  Pulmonary:     Effort: Pulmonary effort is normal.     Breath sounds: Normal breath sounds.  Abdominal:     General: Bowel sounds are normal.     Palpations: Abdomen is soft. There is no mass.     Tenderness: There is no abdominal tenderness.  Musculoskeletal:     Cervical back: Neck supple.     Right lower leg: No edema.     Left lower  leg: No edema.  Lymphadenopathy:     Cervical: No cervical adenopathy.  Skin:    General: Skin is warm and dry.  Neurological:     Mental Status: She is alert and oriented to person, place, and time.     Cranial Nerves: No facial asymmetry.     Sensory: Sensation is intact.     Motor: No weakness, tremor or pronator drift.     Coordination: Romberg sign negative.     Gait: Gait is intact. Gait normal.     Deep Tendon Reflexes:     Reflex Scores:      Patellar reflexes are 2+ on the right side and 2+ on the left side. Psychiatric:        Attention and Perception: Attention normal.        Speech: Speech normal.        Behavior: Behavior normal. Behavior is cooperative.     Comments: Pleasant and engaged           Assessment & Plan:    Encounter Diagnoses  Name Primary?   Encounter for annual wellness visit Yes   Encounter for screening for malignant neoplasm of breast, unspecified screening modality    Not proficient in English  language      -pt was referred for screening Mammogram which is due in October -No labs at this time -pt is scheduled to RTO to Repeat PAP -pt reminded to get enrollment updated -pt is encouraged to continue regular exercise

## 2024-05-28 ENCOUNTER — Other Ambulatory Visit: Payer: Self-pay | Admitting: Physician Assistant

## 2024-05-28 DIAGNOSIS — Z1231 Encounter for screening mammogram for malignant neoplasm of breast: Secondary | ICD-10-CM

## 2024-06-20 ENCOUNTER — Encounter: Payer: Self-pay | Admitting: Physician Assistant

## 2024-06-20 ENCOUNTER — Other Ambulatory Visit (HOSPITAL_COMMUNITY)
Admission: RE | Admit: 2024-06-20 | Discharge: 2024-06-20 | Disposition: A | Discharge status: Home / Self Care | Payer: Self-pay | Source: Ambulatory Visit | Attending: Physician Assistant | Admitting: Physician Assistant

## 2024-06-20 ENCOUNTER — Ambulatory Visit: Payer: Self-pay | Admitting: Physician Assistant

## 2024-06-20 VITALS — BP 110/70 | HR 92 | Temp 98.3°F | Ht 59.5 in | Wt 186.2 lb

## 2024-06-20 DIAGNOSIS — Z789 Other specified health status: Secondary | ICD-10-CM

## 2024-06-20 DIAGNOSIS — Z1211 Encounter for screening for malignant neoplasm of colon: Secondary | ICD-10-CM

## 2024-06-20 DIAGNOSIS — Z01419 Encounter for gynecological examination (general) (routine) without abnormal findings: Secondary | ICD-10-CM | POA: Insufficient documentation

## 2024-06-20 NOTE — Progress Notes (Signed)
   Pulse 92   Temp 98.3 F (36.8 C)   LMP 06/02/2024 (Approximate)   SpO2 99%    Subjective:    Patient ID: Carol Huff, female    DOB: 1976-09-13, 48 y.o.   MRN: [id number]  HPI: Carol Huff is a 48 y.o. female presenting on 06/20/2024 for Gynecologic Exam   HPI  Pt is feeling well today.   PAP in 2024 +HPV  Relevant past medical, surgical, family and social history reviewed and updated as indicated. Interim medical history since our last visit reviewed. Allergies and medications reviewed and updated.  Review of Systems  Per HPI unless specifically indicated above     Objective:    Pulse 92   Temp 98.3 F (36.8 C)   LMP 06/02/2024 (Approximate)   SpO2 99%   Wt Readings from Last 3 Encounters:  05/21/24 191 lb (86.6 kg)  08/25/23 194 lb 0.1 oz (88 kg)  05/11/23 191 lb 8 oz (86.9 kg)    Physical Exam Vitals and nursing note reviewed. Exam conducted with a chaperone present.  Constitutional:      General: She is not in acute distress.    Appearance: She is well-developed. She is not toxic-appearing.  Pulmonary:     Effort: Pulmonary effort is normal.  Chest:  Breasts:    Right: Normal.     Left: Normal.  Abdominal:     Palpations: Abdomen is soft. There is no mass.     Tenderness: There is no abdominal tenderness. There is no guarding or rebound.  Genitourinary:    Labia:        Right: No rash, tenderness or lesion.        Left: No rash, tenderness or lesion.      Vagina: Normal.     Cervix: No cervical motion tenderness, discharge or friability.     Uterus: Normal.      Adnexa:        Right: No mass, tenderness or fullness.         Left: No mass, tenderness or fullness.    Musculoskeletal:     Right lower leg: No edema.     Left lower leg: No edema.  Skin:    General: Skin is warm and dry.  Neurological:     Mental Status: She is alert and oriented to person, place, and time.  Psychiatric:        Behavior: Behavior normal.            Assessment & Plan:   Encounter Diagnoses  Name Primary?   Encounter for routine gynecological examination with Papanicolaou smear of cervix Yes   Screening for colon cancer    Not proficient in English language       -pt was given FIT test for colon cancer screening -she has mammogram scheduled for November -she will RTO one year.  She is to contact office sooner prn

## 2024-06-20 NOTE — Patient Instructions (Signed)
 Perimenopausia: qu debe saber Perimenopause: What to Know La perimenopausia es la etapa de la vida en la que los niveles de estrgeno comienzan a disminuir. El estrgeno es la hormona femenina que producen los ovarios. La perimenopausia puede empezar de 2 a 8 aos antes de [employer]. Puede causar cambios en el perodo menstrual. [employer], los ovarios podran fabricar un vulo o no. En muchos casos, an puede quedar embarazada. Cules son las causas? La perimenopausia es un cambio natural en los niveles hormonales que ocurre a medida que pasa el [place]. Qu incrementa el riesgo? Es ms probable que comience la perimenopausia de forma temprana si: Tiene un crecimiento anormal (tumor) de la hipfisis en el cerebro. Tiene una enfermedad que [employer] ovarios. Recibe determinados tratamientos para el cncer. Incluyen lo siguiente: Quimioterapia. Terapia hormonal. Radioterapia en la zona que est entre las caderas (pelvis). Fuma mucho o bebe mucho alcohol. Otras miembros de la familia han pasado por la menopausia de forma temprana. Cules son los signos o sntomas? Los sntomas son nicos para Advertising account planner. Es posible que tenga: Acaloramiento. Perodos irregulares. Sudoracin nocturna. Cambios en cmo se siente con respecto al sexo. Puede tener menos deseo sexual o sentir ms incomodidad en torno a Engineer, building services. Sequedad vaginal. Dolores de cabeza. Cambios en el estado de nimo. Otros sntomas pueden incluir: Depresin. Esta ocurre cuando se siente tristeza o desesperanza. Dificultad para dormir. Problemas de memoria o dificultad para concentrarse. Irritabilidad. Esto significa que se enoja fcilmente. Cansancio. Aumento de [place]. Ansiedad. Esto es sentir preocupacin o nerviosismo. Tambin puede tener dificultad para quedar embarazada. Cmo se diagnostica? Pueden diagnosticarle esta afeccin en funcin de lo siguiente: Sus antecedentes mdicos. Un examen. Su  edad. Sus antecedentes de perodos menstruales. Sus sntomas. Pruebas hormonales. Cmo se trata? En algunos los casos, no se necesita tratamiento. Hable con su mdico acerca de si debera recibir algn tratamiento. Los tratamientos pueden incluir lo siguiente: Terapia hormonal para la menopausia (THM). Medicamentos para tratar ciertos sntomas. Acupuntura. Vitaminas o suplementos herbales. Antes de Microbiologist, informe al mdico si usted o alguien de su familia tiene o ha tenido: Enfermedad cardaca. Cncer de mama. Cogulos de [place]. Diabetes. Osteoporosis. Siga estas instrucciones en su casa: Comida y bebida  Consuma una dieta equilibrada. Debe incluir: Burnadette Carrion y verduras frescas. Cereales integrales. Soja. Huevos. Carne magra. Lcteos con bajo contenido de grasa. Para ayudar a prevenir los acaloramientos, no consuma lo siguiente: Alcohol. Bebidas con cafena. Comidas condimentadas. Estilo de vida No fume, vapee ni consuma nicotina o tabaco. Realice, por lo menos, 30 minutos de actividad fsica 5 das por semana o ms. Intente dormir de 7 a 8 horas todas las noches. Vstase con capas de ropa que puedan quitarse si tiene un acaloramiento. Busque maneras de Charity fundraiser. Tal vez deba intentar lo siguiente: Respiracin profunda. Meditacin. Escribir un diario. Instrucciones generales  Use los medicamentos nicamente segn las indicaciones. Lleve un registro de sus perodos menstruales. Registre: Cundo aparecen. Qu tan abundantes son. Cunto tiempo duran. Cunto tiempo transcurre entre cada perodo menstrual. Lleve un registro de los sntomas. Registre: Cundo comienzan. Con qu frecuencia los tiene. Cunto tiempo duran. Use lubricantes o humectantes vaginales. Estos pueden ayudar con: Sequedad vaginal. La comodidad durante el sexo. An puede quedar embarazada si tiene el perodo menstrual espordicamente. Asegrese de usar un mtodo  anticonceptivo si no desea quedar embarazada. Comunquese con un mdico si: Tiene un perodo muy abundante o le salen cogulos de sangre. Su perodo menstrual dura ms  de 2 [employer] lo habitual. El perodo regresa antes de los [place]. Sangra despus de [employer]. Siente dolor durante el sexo. Siente dolor al [employer]. Tiene dolores de [place] muy intensos. Tiene problemas con la vista. Solicite ayuda de inmediato si: Medical laboratory scientific officer. Tiene dificultad para respirar. Tiene dificultad para hablar. Tiene una depresin muy intensa. Esta informacin no tiene Theme park manager el consejo del mdico. Asegrese de hacerle al mdico cualquier pregunta que tenga. Document Revised: 07/13/2023 Document Reviewed: 07/13/2023 Elsevier Patient Education  2024 [employer].

## 2024-06-24 LAB — CYTOLOGY - PAP
Comment: NEGATIVE
Diagnosis: NEGATIVE
High risk HPV: NEGATIVE

## 2024-06-25 ENCOUNTER — Other Ambulatory Visit: Payer: Self-pay | Admitting: Physician Assistant

## 2024-06-25 ENCOUNTER — Ambulatory Visit: Payer: Self-pay | Admitting: Physician Assistant

## 2024-06-25 MED ORDER — METRONIDAZOLE 500 MG PO TABS: 500.0000 mg | ORAL_TABLET | Freq: Two times a day (BID) | ORAL | 0 refills | Status: AC

## 2024-07-31 ENCOUNTER — Other Ambulatory Visit: Payer: Self-pay | Admitting: Physician Assistant

## 2024-07-31 MED ORDER — ACYCLOVIR 400 MG PO TABS: 400.0000 mg | ORAL_TABLET | Freq: Two times a day (BID) | ORAL | 11 refills | Status: AC

## 2024-09-02 ENCOUNTER — Encounter (HOSPITAL_COMMUNITY): Payer: Self-pay

## 2024-09-02 ENCOUNTER — Ambulatory Visit (HOSPITAL_COMMUNITY)
Admission: RE | Admit: 2024-09-02 | Discharge: 2024-09-02 | Disposition: A | Discharge status: Home / Self Care | Payer: Self-pay | Source: Ambulatory Visit | Attending: Physician Assistant | Admitting: Physician Assistant

## 2024-09-02 DIAGNOSIS — Z1231 Encounter for screening mammogram for malignant neoplasm of breast: Secondary | ICD-10-CM | POA: Insufficient documentation

## 2025-05-28 ENCOUNTER — Ambulatory Visit: Payer: Self-pay | Admitting: Physician Assistant
